# Patient Record
Sex: Female | Born: 1960 | Race: Black or African American | Hispanic: No | Marital: Married | Smoking: Never smoker
Health system: Southern US, Community
[De-identification: ages and names within clinical notes are randomized; demographics above are authoritative.]

## PROBLEM LIST (undated history)

## (undated) DIAGNOSIS — E785 Hyperlipidemia, unspecified: Secondary | ICD-10-CM

## (undated) DIAGNOSIS — J45909 Unspecified asthma, uncomplicated: Secondary | ICD-10-CM

## (undated) DIAGNOSIS — M1990 Unspecified osteoarthritis, unspecified site: Secondary | ICD-10-CM

## (undated) DIAGNOSIS — E079 Disorder of thyroid, unspecified: Secondary | ICD-10-CM

## (undated) DIAGNOSIS — G510 Bell's palsy: Secondary | ICD-10-CM

## (undated) DIAGNOSIS — E039 Hypothyroidism, unspecified: Secondary | ICD-10-CM

## (undated) DIAGNOSIS — I639 Cerebral infarction, unspecified: Secondary | ICD-10-CM

## (undated) DIAGNOSIS — I10 Essential (primary) hypertension: Secondary | ICD-10-CM

## (undated) DIAGNOSIS — C801 Malignant (primary) neoplasm, unspecified: Secondary | ICD-10-CM

## (undated) HISTORY — PX: COLONOSCOPY: SHX174

## (undated) HISTORY — PX: EYE SURGERY: SHX253

## (undated) HISTORY — PX: ABDOMINAL HYSTERECTOMY: SHX81

## (undated) HISTORY — DX: Disorder of thyroid, unspecified: E07.9

## (undated) HISTORY — PX: THYROIDECTOMY: SHX17

## (undated) HISTORY — DX: Essential (primary) hypertension: I10

## (undated) HISTORY — PX: TUBAL LIGATION: SHX77

---

## 1985-01-29 HISTORY — PX: TUBAL LIGATION: SHX77

## 1999-01-30 HISTORY — PX: ABDOMINAL HYSTERECTOMY: SHX81

## 2009-01-29 HISTORY — PX: COLONOSCOPY: SHX174

## 2009-01-29 HISTORY — PX: TOTAL THYROIDECTOMY: SHX2547

## 2012-03-10 ENCOUNTER — Ambulatory Visit: Payer: Self-pay | Admitting: Obstetrics and Gynecology

## 2012-10-15 ENCOUNTER — Ambulatory Visit: Payer: Self-pay | Admitting: Orthopedic Surgery

## 2013-12-02 ENCOUNTER — Ambulatory Visit: Payer: Self-pay | Admitting: Internal Medicine

## 2014-03-09 DIAGNOSIS — E785 Hyperlipidemia, unspecified: Secondary | ICD-10-CM | POA: Insufficient documentation

## 2014-03-09 DIAGNOSIS — E039 Hypothyroidism, unspecified: Secondary | ICD-10-CM | POA: Insufficient documentation

## 2014-03-09 DIAGNOSIS — J45909 Unspecified asthma, uncomplicated: Secondary | ICD-10-CM | POA: Insufficient documentation

## 2014-03-09 DIAGNOSIS — I10 Essential (primary) hypertension: Secondary | ICD-10-CM | POA: Insufficient documentation

## 2014-11-29 ENCOUNTER — Other Ambulatory Visit: Payer: Self-pay | Admitting: Internal Medicine

## 2014-11-29 DIAGNOSIS — Z1231 Encounter for screening mammogram for malignant neoplasm of breast: Secondary | ICD-10-CM

## 2014-12-13 ENCOUNTER — Ambulatory Visit
Admission: RE | Admit: 2014-12-13 | Discharge: 2014-12-13 | Disposition: A | Discharge status: Home / Self Care | Payer: BLUE CROSS/BLUE SHIELD | Source: Ambulatory Visit | Attending: Internal Medicine | Admitting: Internal Medicine

## 2014-12-13 DIAGNOSIS — Z1231 Encounter for screening mammogram for malignant neoplasm of breast: Secondary | ICD-10-CM

## 2014-12-13 HISTORY — DX: Malignant (primary) neoplasm, unspecified: C80.1

## 2015-01-30 DIAGNOSIS — Q859 Phakomatosis, unspecified: Secondary | ICD-10-CM

## 2015-01-30 HISTORY — DX: Phakomatosis, unspecified: Q85.9

## 2015-02-10 ENCOUNTER — Other Ambulatory Visit: Payer: Self-pay | Admitting: Internal Medicine

## 2015-02-10 DIAGNOSIS — R911 Solitary pulmonary nodule: Secondary | ICD-10-CM

## 2015-02-23 ENCOUNTER — Ambulatory Visit
Admission: RE | Admit: 2015-02-23 | Discharge: 2015-02-23 | Disposition: A | Discharge status: Home / Self Care | Payer: BLUE CROSS/BLUE SHIELD | Source: Ambulatory Visit | Attending: Internal Medicine | Admitting: Internal Medicine

## 2015-02-23 DIAGNOSIS — R911 Solitary pulmonary nodule: Secondary | ICD-10-CM | POA: Insufficient documentation

## 2015-02-24 ENCOUNTER — Other Ambulatory Visit: Payer: Self-pay | Admitting: Internal Medicine

## 2015-02-24 DIAGNOSIS — R911 Solitary pulmonary nodule: Secondary | ICD-10-CM

## 2015-03-02 ENCOUNTER — Ambulatory Visit: Admission: RE | Admit: 2015-03-02 | Payer: BLUE CROSS/BLUE SHIELD | Source: Ambulatory Visit

## 2015-03-23 ENCOUNTER — Ambulatory Visit
Admission: RE | Admit: 2015-03-23 | Discharge: 2015-03-23 | Disposition: A | Discharge status: Home / Self Care | Payer: BLUE CROSS/BLUE SHIELD | Source: Ambulatory Visit | Attending: Internal Medicine | Admitting: Internal Medicine

## 2015-03-23 DIAGNOSIS — R918 Other nonspecific abnormal finding of lung field: Secondary | ICD-10-CM | POA: Diagnosis not present

## 2015-03-23 DIAGNOSIS — R911 Solitary pulmonary nodule: Secondary | ICD-10-CM

## 2015-03-23 LAB — GLUCOSE, CAPILLARY: GLUCOSE-CAPILLARY: 106 mg/dL — AB (ref 65–99)

## 2015-03-23 MED ORDER — FLUDEOXYGLUCOSE F - 18 (FDG) INJECTION
12.6100 | Freq: Once | INTRAVENOUS | Status: AC | PRN
  Administered 2015-03-23: 12.61 via INTRAVENOUS

## 2015-04-07 ENCOUNTER — Encounter: Payer: Self-pay | Admitting: Cardiothoracic Surgery

## 2015-04-07 ENCOUNTER — Inpatient Hospital Stay: Payer: BLUE CROSS/BLUE SHIELD | Attending: Cardiothoracic Surgery | Admitting: Cardiothoracic Surgery

## 2015-04-07 VITALS — BP 167/81 | HR 54 | Temp 98.6°F | Ht 65.0 in | Wt 206.0 lb

## 2015-04-07 DIAGNOSIS — Z90710 Acquired absence of both cervix and uterus: Secondary | ICD-10-CM | POA: Diagnosis not present

## 2015-04-07 DIAGNOSIS — R918 Other nonspecific abnormal finding of lung field: Secondary | ICD-10-CM | POA: Diagnosis not present

## 2015-04-07 DIAGNOSIS — Z85850 Personal history of malignant neoplasm of thyroid: Secondary | ICD-10-CM | POA: Insufficient documentation

## 2015-04-07 DIAGNOSIS — R0602 Shortness of breath: Secondary | ICD-10-CM | POA: Insufficient documentation

## 2015-04-07 DIAGNOSIS — M2550 Pain in unspecified joint: Secondary | ICD-10-CM | POA: Diagnosis not present

## 2015-04-07 DIAGNOSIS — R6883 Chills (without fever): Secondary | ICD-10-CM | POA: Insufficient documentation

## 2015-04-07 DIAGNOSIS — Z79899 Other long term (current) drug therapy: Secondary | ICD-10-CM | POA: Diagnosis not present

## 2015-04-07 DIAGNOSIS — I10 Essential (primary) hypertension: Secondary | ICD-10-CM | POA: Diagnosis not present

## 2015-04-07 DIAGNOSIS — R911 Solitary pulmonary nodule: Secondary | ICD-10-CM | POA: Diagnosis not present

## 2015-04-07 DIAGNOSIS — E890 Postprocedural hypothyroidism: Secondary | ICD-10-CM | POA: Diagnosis not present

## 2015-04-07 DIAGNOSIS — R05 Cough: Secondary | ICD-10-CM | POA: Insufficient documentation

## 2015-04-07 NOTE — Progress Notes (Signed)
Patient ID: Brenda Mckinney, female   DOB: 24-Jan-1961, 55 y.o.   MRN: [id number]  No chief complaint on file.   Referred By Dr. Lisette Grinder Reason for Referral right lower lobe mass  HPI Location, Quality, Duration, Severity, Timing, Context, Modifying Factors, Associated Signs and Symptoms.  Brenda Mckinney is a 55 y.o. female.  Her initial problems began several months ago towards October November when she experienced which she described as a cough. At that time she was on lisinopril and the lisinopril was held without any significant changes in her cough. Therefore the lisinopril was restarted and she was treated appropriately. In addition she was placed on an inhaler and ultimately a chest x-ray was done which was abnormal. This demonstrated a small right lower lobe nodule and a subsequent CT scan and PET scan were performed. I have independently reviewed both of these. 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 In addition the PET scan showed some mild uptake which was indeterminate for malignancy. Again this would be consistent with carcinoid or a low-grade adenocarcinoma. The patient states that otherwise she's been well and since February of this year her symptoms have completely resolved. She denied any fevers or chills. She denied any hemoptysis. She denied any weight loss. She does have occasional shortness of breath and wheezing whenever she is active. She is a lifelong nonsmoker.  The patient did undergo a thyroidectomy in 2011 followed by radioactive iodine for a thyroid carcinoma at [facility] in [place]. In addition about 15 years ago she underwent a partial hysterectomy for uterine fibroids. She has not established care with any oncologist at this time.   Past Medical History  Diagnosis Date  . Cancer ([place])     thyroid  . Hypertension   . Thyroid disease     No past surgical history  on file.  Family History  Problem Relation Age of Onset  . Breast cancer Paternal Aunt   . Lymphoma Sister     Social History Social History  Substance Use Topics  . Smoking status: Not on file  . Smokeless tobacco: Not on file  . Alcohol Use: Not on file    No Known Allergies  Current Outpatient Prescriptions  Medication Sig Dispense Refill  . Albuterol Sulfate 108 (90 Base) MCG/ACT AEPB Inhale into the lungs.    Marland Kitchen amLODipine (NORVASC) 5 MG tablet Take by mouth.    . fluticasone (FLONASE) 50 MCG/ACT nasal spray Place into the nose.    Marland Kitchen Fluticasone-Salmeterol (ADVAIR) 250-50 MCG/DOSE AEPB Inhale 1 puff into the lungs 2 (two) times daily.    Marland Kitchen levothyroxine (SYNTHROID, LEVOTHROID) 112 MCG tablet     . lisinopril-hydrochlorothiazide (PRINZIDE,ZESTORETIC) 20-25 MG tablet     . metoprolol tartrate (LOPRESSOR) 25 MG tablet     . simvastatin (ZOCOR) 40 MG tablet      No current facility-administered medications for this visit.      Review of Systems A complete review of systems was asked and was negative except for the following positive findingsOccasional chills, difficulty with vision, cough, shortness of breath, wheeze, joint pain.  Blood pressure 167/81, pulse 54, temperature 98.6 F (37 C), temperature source Oral, height 5\' 5"  (1.651 m), weight 206 lb (93.441 kg), SpO2 97 %.  Physical Exam CONSTITUTIONAL:  Pleasant, well-developed, well-nourished, and in no acute distress. EYES: Pupils equal and reactive to light, Sclera non-icteric EARS, NOSE, MOUTH AND THROAT:  The oropharynx was clear.  Dentition is good repair.  Oral mucosa pink and moist. LYMPH NODES:  Lymph nodes in the neck and axillae were normal RESPIRATORY:  Lungs were clear.  Normal respiratory effort without pathologic use of accessory muscles of respiration CARDIOVASCULAR: Heart was regular without murmurs.  There were no carotid bruits. GI: The abdomen was soft, nontender, and nondistended. There were no  palpable masses. There was no hepatosplenomegaly. There were normal bowel sounds in all quadrants. GU:  Rectal deferred.   MUSCULOSKELETAL:  Normal muscle strength and tone.  No clubbing or cyanosis.   SKIN:  There were no pathologic skin lesions.  There were no nodules on palpation. NEUROLOGIC:  Sensation is normal.  Cranial nerves are grossly intact. PSYCH:  Oriented to person, place and time.  Mood and affect are normal.  Data Reviewed CT scan and PET scan  I have personally reviewed the patient's imaging, laboratory findings and medical records.    Assessment    I have independently reviewed the patient's CT scan and PET scan. 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 I had a long discussion with her and her husband regarding the options. I'm surprised that a CT scan of the chest has not been performed since she had thyroid cancer with radioactive iodine. I asked the patient to obtain all of her prior records from that this hospital in [place] and make another appointment so I can review them. In addition I would like her to see one of our oncologist for continued surveillance.    Plan    I gave the patient my business card and told her to contact Mr. Melinda Crutch next week when he returns. He can assist her in obtaining her records from [place]. When these records are available I will see her back. I do think that these be helpful making further decisions regarding the management of her right lower lobe lung nodule.       Nestor Lewandowsky, MD 04/07/2015, 5:20 PM

## 2015-05-18 ENCOUNTER — Other Ambulatory Visit: Payer: Self-pay | Admitting: Cardiothoracic Surgery

## 2015-05-18 ENCOUNTER — Inpatient Hospital Stay
Admission: RE | Admit: 2015-05-18 | Discharge: 2015-05-18 | Disposition: A | Discharge status: Home / Self Care | Payer: Self-pay | Source: Ambulatory Visit | Attending: Cardiothoracic Surgery | Admitting: Cardiothoracic Surgery

## 2015-05-18 DIAGNOSIS — R911 Solitary pulmonary nodule: Secondary | ICD-10-CM

## 2015-05-19 ENCOUNTER — Encounter: Payer: Self-pay | Admitting: Cardiothoracic Surgery

## 2015-05-19 ENCOUNTER — Inpatient Hospital Stay: Payer: BLUE CROSS/BLUE SHIELD

## 2015-05-19 ENCOUNTER — Inpatient Hospital Stay: Payer: BLUE CROSS/BLUE SHIELD | Attending: Cardiothoracic Surgery | Admitting: Cardiothoracic Surgery

## 2015-05-19 VITALS — BP 147/102 | HR 53 | Temp 98.1°F | Ht 65.0 in | Wt 201.8 lb

## 2015-05-19 DIAGNOSIS — R918 Other nonspecific abnormal finding of lung field: Secondary | ICD-10-CM | POA: Insufficient documentation

## 2015-05-19 LAB — CBC WITH DIFFERENTIAL/PLATELET
BASOS PCT: 1 %
Basophils Absolute: 0.1 10*3/uL (ref 0–0.1)
EOS ABS: 0.7 10*3/uL (ref 0–0.7)
EOS PCT: 12 %
HCT: 40.6 % (ref 35.0–47.0)
HEMOGLOBIN: 13.6 g/dL (ref 12.0–16.0)
Lymphocytes Relative: 32 %
Lymphs Abs: 1.9 10*3/uL (ref 1.0–3.6)
MCH: 28 pg (ref 26.0–34.0)
MCHC: 33.6 g/dL (ref 32.0–36.0)
MCV: 83.5 fL (ref 80.0–100.0)
MONO ABS: 0.5 10*3/uL (ref 0.2–0.9)
MONOS PCT: 8 %
NEUTROS PCT: 47 %
Neutro Abs: 2.9 10*3/uL (ref 1.4–6.5)
PLATELETS: 284 10*3/uL (ref 150–440)
RBC: 4.86 MIL/uL (ref 3.80–5.20)
RDW: 14.2 % (ref 11.5–14.5)
WBC: 6.1 10*3/uL (ref 3.6–11.0)

## 2015-05-19 LAB — COMPREHENSIVE METABOLIC PANEL
ALBUMIN: 4.7 g/dL (ref 3.5–5.0)
ALT: 24 U/L (ref 14–54)
ANION GAP: 6 (ref 5–15)
AST: 23 U/L (ref 15–41)
Alkaline Phosphatase: 99 U/L (ref 38–126)
BUN: 13 mg/dL (ref 6–20)
CO2: 30 mmol/L (ref 22–32)
Calcium: 9.3 mg/dL (ref 8.9–10.3)
Chloride: 103 mmol/L (ref 101–111)
Creatinine, Ser: 0.81 mg/dL (ref 0.44–1.00)
GFR calc non Af Amer: >60 mL/min (ref >60)
GLUCOSE: 106 mg/dL — AB (ref 65–99)
POTASSIUM: 3.5 mmol/L (ref 3.5–5.1)
SODIUM: 139 mmol/L (ref 135–145)
Total Bilirubin: 0.5 mg/dL (ref 0.3–1.2)
Total Protein: 8 g/dL (ref 6.5–8.1)

## 2015-05-19 LAB — APTT: APTT: 32 s (ref 24–36)

## 2015-05-19 LAB — PROTIME-INR
INR: 1.17
Prothrombin Time: 15.1 seconds — ABNORMAL HIGH (ref 11.4–15.0)

## 2015-05-19 NOTE — Progress Notes (Signed)
[facility] Post-Op Note  Patient ID: Brenda Mckinney, female   DOB: [birth year], 55 y.o.   MRN: [id number]  HISTORY: She returns today in follow-up. She's had no new problems. She did obtain all the records from [place] regarding her thyroid surgery. Unfortunately none of these contained a CT scan of the chest or a PET scan.   Filed Vitals:   05/19/15 1423  BP: 147/102  Pulse: 53  Temp: 98.1 F (36.7 C)     EXAM: Resp: Lungs are clear bilaterally.  No respiratory distress, normal effort. Heart:  Regular without murmurs Abd:  Abdomen is soft, non distended and non tender. No masses are palpable.  There is no rebound and no guarding.  Neurological: Alert and oriented to person, place, and time. Coordination normal.  Skin: Skin is warm and dry. No rash noted. No diaphoretic. No erythema. No pallor.  Psychiatric: Normal mood and affect. Normal behavior. Judgment and thought content normal.    ASSESSMENT: I have reviewed the results of the CT scan and PET scan have been made here. I discussed this with her today and her husband who participated by phone. The right lower lobe mass is rounded and only mildly hypermetabolic suggesting this might be a benign lesion. However during her presentation her multidisciplinary thoracic oncology conference they recommended a percutaneous biopsy. 4 we will go ahead and set that up.   PLAN:   We will obtain some routine laboratory studies today. We will obtain a CT-guided needle biopsy. I will see the patient back next week along with one of our oncologist.    Nestor Lewandowsky, MD

## 2015-05-20 ENCOUNTER — Encounter: Payer: Self-pay | Admitting: Cardiothoracic Surgery

## 2015-05-23 ENCOUNTER — Telehealth: Payer: Self-pay | Admitting: Cardiothoracic Surgery

## 2015-05-23 NOTE — Telephone Encounter (Signed)
Patient returned phone call. I explained information below. I explained that if she has not been scheduled by Wednesday, 05/25/15; I would like her to contact me so that I can call the radiologist once again. She verbalizes understanding.

## 2015-05-23 NOTE — Telephone Encounter (Signed)
Patient has called and states that she has not been advised of when her lung biopsy appointment. She stated that it was initially scheduled for 05/23/15 however it was canceled and that someone was to contact her to set up a new time. She states that she was advised that she would have an appointment with Dr Genevive Bi on 05/26/15 to discuss the results. Please call patient with appointment time and date.

## 2015-05-23 NOTE — Telephone Encounter (Signed)
Spoke with [employer] at this time. They explained that the case in review by the radiologist at this time and as soon as the review has been completed, Central Scheduling will call the patient to set this up.  Called patient to explain to her what the status is. No answer. Left voicemail requesting return phone call.

## 2015-05-24 ENCOUNTER — Telehealth: Payer: Self-pay

## 2015-05-24 NOTE — Telephone Encounter (Signed)
Special Procedures form filled out and faxed to [facility] at this time.

## 2015-05-25 NOTE — Telephone Encounter (Signed)
Spoke with Judy from Scheduling whom states that CT scanner is down and they do not know when this will be available for scheduling until repair person gets here at 7pm tonight. Will need to call Barbara back in am for update on scheduling this. 

## 2015-05-26 ENCOUNTER — Inpatient Hospital Stay: Payer: BLUE CROSS/BLUE SHIELD | Admitting: Cardiothoracic Surgery

## 2015-05-27 NOTE — Telephone Encounter (Signed)
Spoke with Dr. Genevive Bi and he explained that patient does not wish to have this done at this time. We will see the patient back in follow-up and then decide on tests needed.

## 2015-06-02 ENCOUNTER — Inpatient Hospital Stay: Payer: BLUE CROSS/BLUE SHIELD | Attending: Cardiothoracic Surgery | Admitting: Cardiothoracic Surgery

## 2015-06-02 NOTE — Progress Notes (Deleted)
Subjective:     Patient ID: Brenda Mckinney, female   DOB: 09/22/1960, 55 y.o.   MRN: [id number]  HPI   Review of Systems     Objective:   Physical Exam     Assessment:     ***    Plan:     ***

## 2015-06-10 ENCOUNTER — Encounter: Payer: Self-pay | Admitting: Cardiothoracic Surgery

## 2015-06-10 ENCOUNTER — Ambulatory Visit (INDEPENDENT_AMBULATORY_CARE_PROVIDER_SITE_OTHER): Payer: BLUE CROSS/BLUE SHIELD | Admitting: Cardiothoracic Surgery

## 2015-06-10 VITALS — BP 168/95 | HR 55 | Temp 98.1°F | Ht 65.0 in | Wt 202.0 lb

## 2015-06-10 DIAGNOSIS — R918 Other nonspecific abnormal finding of lung field: Secondary | ICD-10-CM

## 2015-06-10 NOTE — Progress Notes (Signed)
[facility]in[facility] Inpatient Post-Op Note  Patient ID: Brenda Mckinney, female   DOB: [birth year], 55 y.o.   MRN: [id number]  HISTORY: She returns today in follow-up. She had a chest x-ray that was made back in 2011 that was found on her information obtained from [place]. This was compared to some more recent x-rays that she had made. The nodule in the lung is certainly slightly larger. She comes in today to discuss the options for management. She does not have any shortness of breath. She's had no fevers or chills.   Filed Vitals:   06/10/15 1011  BP: 168/95  Pulse: 55  Temp: 98.1 F (36.7 C)     EXAM: Resp: Lungs are clear bilaterally.  No respiratory distress, normal effort. Heart:  Regular without murmurs Abd:  Abdomen is soft, non distended and non tender. No masses are palpable.  There is no rebound and no guarding.  Neurological: Alert and oriented to person, place, and time. Coordination normal.  Skin: Skin is warm and dry. No rash noted. No diaphoretic. No erythema. No pallor.  Psychiatric: Normal mood and affect. Normal behavior. Judgment and thought content normal.    ASSESSMENT: I have compared the chest x-rays from 2011 and 2017. The nodule in the right lower lobe is certainly somewhat larger. I would estimated to be anywhere between 2 and 4 mm larger based upon chest x-ray findings. I had a long discussion with her regarding the options. She would like to have surgery to have this removed. I explained her that we could do this through a thoracotomy or thoracoscopy. I told her the dangers and disadvantages of various options.   PLAN:   She is scheduled to attend a wedding recently likely and is also scheduled to have a new addition to her family. She would therefore like to wait until the end of June to schedule her surgery. She'll come back at that time and will make further recommendations.    Nestor Lewandowsky, MD

## 2015-07-05 ENCOUNTER — Other Ambulatory Visit: Payer: Self-pay | Admitting: Cardiothoracic Surgery

## 2015-07-05 ENCOUNTER — Inpatient Hospital Stay
Admission: RE | Admit: 2015-07-05 | Discharge: 2015-07-05 | Disposition: A | Discharge status: Home / Self Care | Payer: Self-pay | Source: Ambulatory Visit | Attending: Cardiothoracic Surgery | Admitting: Cardiothoracic Surgery

## 2015-07-05 DIAGNOSIS — R918 Other nonspecific abnormal finding of lung field: Secondary | ICD-10-CM

## 2015-07-12 ENCOUNTER — Telehealth: Payer: Self-pay

## 2015-07-12 ENCOUNTER — Ambulatory Visit (INDEPENDENT_AMBULATORY_CARE_PROVIDER_SITE_OTHER): Payer: BLUE CROSS/BLUE SHIELD | Admitting: Cardiothoracic Surgery

## 2015-07-12 ENCOUNTER — Encounter: Payer: Self-pay | Admitting: Cardiothoracic Surgery

## 2015-07-12 ENCOUNTER — Other Ambulatory Visit: Payer: Self-pay

## 2015-07-12 VITALS — BP 167/82 | HR 51 | Temp 98.3°F | Ht 65.0 in | Wt 209.6 lb

## 2015-07-12 DIAGNOSIS — R918 Other nonspecific abnormal finding of lung field: Secondary | ICD-10-CM

## 2015-07-12 NOTE — Progress Notes (Signed)
Patient ID: Brenda Mckinney, female   DOB: 1960/05/06, 55 y.o.   MRN: [id number]  Chief Complaint  Patient presents with  . Follow-up    left Lung Nodule    HPI Brenda Mckinney is a 55 y.o. female.  She carries a diagnosis of thyroid cancer and is status post thyroidectomy for that. She was recently found to have a right lower lobe nodule which upon further review is been present for several years but slightly increased in size. However she would like to have this resected. She does not complain of any shortness of breath. She states she has had some weight gain. She has some occasional right-sided abdominal pain but no cough fevers or chills.   Past Medical History  Diagnosis Date  . Cancer ([place])     thyroid  . Hypertension   . Thyroid disease     History reviewed. No pertinent past surgical history.  Family History  Problem Relation Age of Onset  . Breast cancer Paternal Aunt   . Lymphoma Sister     Social History Social History  Substance Use Topics  . Smoking status: Never Smoker   . Smokeless tobacco: Never Used  . Alcohol Use: No    No Known Allergies  Current Outpatient Prescriptions  Medication Sig Dispense Refill  . Albuterol Sulfate 108 (90 Base) MCG/ACT AEPB Inhale into the lungs.    Marland Kitchen amLODipine (NORVASC) 5 MG tablet Take by mouth.    . fluticasone (FLONASE) 50 MCG/ACT nasal spray Place into the nose.    Marland Kitchen Fluticasone-Salmeterol (ADVAIR) 250-50 MCG/DOSE AEPB Inhale 1 puff into the lungs 2 (two) times daily.    Marland Kitchen levothyroxine (SYNTHROID, LEVOTHROID) 112 MCG tablet     . lisinopril-hydrochlorothiazide (PRINZIDE,ZESTORETIC) 20-25 MG tablet     . metoprolol tartrate (LOPRESSOR) 25 MG tablet     . simvastatin (ZOCOR) 40 MG tablet      No current facility-administered medications for this visit.    Location, Quality, Duration, Severity, Timing, Context, Modifying Factors, Associated Signs and Symptoms.  Review of Systems A complete review of systems was  asked and was negative except for the following positive findingsOccasional abdominal pain.  Blood pressure 167/82, pulse 51, temperature 98.3 F (36.8 C), temperature source Oral, height 5\' 5"  (1.651 m), weight 209 lb 9.6 oz (95.074 kg), SpO2 96 %.  Physical Exam CONSTITUTIONAL:  Pleasant, well-developed, well-nourished, and in no acute distress. EYES: Pupils equal and reactive to light, Sclera non-icteric EARS, NOSE, MOUTH AND THROAT:  The oropharynx was clear.  Dentition is good repair.  Oral mucosa pink and moist. LYMPH NODES:  Lymph nodes in the neck and axillae were normal RESPIRATORY:  Lungs showed some and inspiratory wheezes on the left..  Normal respiratory effort without pathologic use of accessory muscles of respiration CARDIOVASCULAR: Heart was regular without murmurs.  There were no carotid bruits. GI: The abdomen was soft, nontender, and nondistended. There were no palpable masses. There was no hepatosplenomegaly. There were normal bowel sounds in all quadrants. GU:   MUSCULOSKELETAL:  Normal muscle strength and tone.  No clubbing or cyanosis.   SKIN:  There were no pathologic skin lesions.  There were no nodules on palpation. NEUROLOGIC:  Sensation is normal.  Cranial nerves are grossly intact. PSYCH:  Oriented to person, place and time.  Mood and affect are normal.  Data Reviewed  I have personally reviewed the patient's imaging and medical records.    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 We plan a preoperative bronchoscopy with right thoracoscopy possible thoracotomy and lung resection. She understands that we will attempt to minimize the extent of her lung resection but a wedge or lobectomy may be required. We'll go ahead and check some pulmonary function studies as well as some routine laboratory examination. Her surgery done towards the end of this month. We will accommodate her  request.       Nestor Lewandowsky, MD 07/12/2015, 10:56 AM

## 2015-07-12 NOTE — Telephone Encounter (Signed)
Called patient, Left message for her to call me back. Also left phone number so she can contact (Cardiopulmonay- [phone]) to reschedule her appointment due to her not being able to go on 07/14/15 2 9:30.

## 2015-07-12 NOTE — Patient Instructions (Signed)
Please call our office if you have questions or concerns.   We will call you with your appointment to have the Pulmonary Function Test.   We will call you with the details of your surgery plans as soon as they are available.   Please see your [facility]) Pre-care sheet for further information.

## 2015-07-12 NOTE — Telephone Encounter (Signed)
Patient called to let me know she had received my message in regards to calling Cardiopulmonary to reschedule her PFT testing. She stated she left a message and is waiting on a call back from scheduling. She was encouraged to try reaching them tomorrow morning as to getting a definite appointment made. She stated she would do so.

## 2015-07-12 NOTE — Telephone Encounter (Signed)
Brenda Mckinney, please call patient. You scheduled her a pulmonary function test on 07/14/15 at 9:15 and she has another appointment at 9:30 that morning. She states that day is okay, just needs a different time on that day.

## 2015-07-12 NOTE — Telephone Encounter (Signed)
LVM to return call -I have her  pulmonary function test  scheduled on 07/14/15 @ 9:15.

## 2015-07-13 ENCOUNTER — Telehealth: Payer: Self-pay | Admitting: Cardiothoracic Surgery

## 2015-07-13 NOTE — Telephone Encounter (Signed)
Pt advised of pre op date/time and sx date. Sx: 07/25/15 with Dr Oaks-Dr Adonis Huguenin assisting--Pre op bronchoscopy, right thoracoscopy with possible right thoracotomy with lung resection.  Pre op: 07/18/15 @ 8:00 am--office.   Patient made aware to call [phone], between 1-3:00 pm the day before surgery, to find out what time to arrive.

## 2015-07-14 ENCOUNTER — Ambulatory Visit: Payer: BLUE CROSS/BLUE SHIELD

## 2015-07-14 ENCOUNTER — Ambulatory Visit: Payer: BLUE CROSS/BLUE SHIELD | Attending: Cardiothoracic Surgery

## 2015-07-14 DIAGNOSIS — R918 Other nonspecific abnormal finding of lung field: Secondary | ICD-10-CM | POA: Diagnosis present

## 2015-07-14 LAB — BLOOD GAS, ARTERIAL
ACID-BASE EXCESS: 3 mmol/L (ref 0.0–3.0)
Allens test (pass/fail): POSITIVE — AB
BICARBONATE: 27.1 meq/L (ref 21.0–28.0)
FIO2: 21
O2 Saturation: 94.6 %
PH ART: 7.45 (ref 7.350–7.450)
PO2 ART: 70 mmHg — AB (ref 83.0–108.0)
Patient temperature: 37
pCO2 arterial: 39 mmHg (ref 32.0–48.0)

## 2015-07-14 MED ORDER — ALBUTEROL SULFATE (2.5 MG/3ML) 0.083% IN NEBU
2.5000 mg | INHALATION_SOLUTION | Freq: Once | RESPIRATORY_TRACT | Status: AC
  Administered 2015-07-14: 2.5 mg via RESPIRATORY_TRACT
  Filled 2015-07-14: qty 3

## 2015-07-18 ENCOUNTER — Telehealth: Payer: Self-pay

## 2015-07-18 ENCOUNTER — Encounter
Admission: RE | Admit: 2015-07-18 | Discharge: 2015-07-18 | Disposition: A | Discharge status: Home / Self Care | Payer: BLUE CROSS/BLUE SHIELD | Source: Ambulatory Visit | Attending: Cardiothoracic Surgery | Admitting: Cardiothoracic Surgery

## 2015-07-18 ENCOUNTER — Ambulatory Visit
Admission: RE | Admit: 2015-07-18 | Discharge: 2015-07-18 | Disposition: A | Discharge status: Home / Self Care | Payer: BLUE CROSS/BLUE SHIELD | Source: Ambulatory Visit | Attending: Cardiothoracic Surgery | Admitting: Cardiothoracic Surgery

## 2015-07-18 DIAGNOSIS — Z01811 Encounter for preprocedural respiratory examination: Secondary | ICD-10-CM

## 2015-07-18 DIAGNOSIS — Z01818 Encounter for other preprocedural examination: Secondary | ICD-10-CM | POA: Diagnosis present

## 2015-07-18 DIAGNOSIS — Z01810 Encounter for preprocedural cardiovascular examination: Secondary | ICD-10-CM | POA: Insufficient documentation

## 2015-07-18 DIAGNOSIS — I10 Essential (primary) hypertension: Secondary | ICD-10-CM | POA: Insufficient documentation

## 2015-07-18 DIAGNOSIS — Z01812 Encounter for preprocedural laboratory examination: Secondary | ICD-10-CM | POA: Diagnosis not present

## 2015-07-18 DIAGNOSIS — I517 Cardiomegaly: Secondary | ICD-10-CM | POA: Diagnosis not present

## 2015-07-18 DIAGNOSIS — R911 Solitary pulmonary nodule: Secondary | ICD-10-CM | POA: Insufficient documentation

## 2015-07-18 DIAGNOSIS — R001 Bradycardia, unspecified: Secondary | ICD-10-CM | POA: Insufficient documentation

## 2015-07-18 HISTORY — DX: Hypothyroidism, unspecified: E03.9

## 2015-07-18 LAB — COMPREHENSIVE METABOLIC PANEL
ALBUMIN: 4.3 g/dL (ref 3.5–5.0)
ALT: 15 U/L (ref 14–54)
AST: 21 U/L (ref 15–41)
Alkaline Phosphatase: 95 U/L (ref 38–126)
Anion gap: 8 (ref 5–15)
BILIRUBIN TOTAL: 0.9 mg/dL (ref 0.3–1.2)
BUN: 12 mg/dL (ref 6–20)
CO2: 28 mmol/L (ref 22–32)
Calcium: 9.1 mg/dL (ref 8.9–10.3)
Chloride: 104 mmol/L (ref 101–111)
Creatinine, Ser: 0.71 mg/dL (ref 0.44–1.00)
GFR calc Af Amer: >60 mL/min (ref >60)
GFR calc non Af Amer: >60 mL/min (ref >60)
GLUCOSE: 68 mg/dL (ref 65–99)
POTASSIUM: 3.3 mmol/L — AB (ref 3.5–5.1)
Sodium: 140 mmol/L (ref 135–145)
TOTAL PROTEIN: 7.8 g/dL (ref 6.5–8.1)

## 2015-07-18 LAB — CBC
HEMATOCRIT: 38.9 % (ref 35.0–47.0)
HEMOGLOBIN: 13 g/dL (ref 12.0–16.0)
MCH: 27.8 pg (ref 26.0–34.0)
MCHC: 33.3 g/dL (ref 32.0–36.0)
MCV: 83.3 fL (ref 80.0–100.0)
Platelets: 274 10*3/uL (ref 150–440)
RBC: 4.67 MIL/uL (ref 3.80–5.20)
RDW: 14.4 % (ref 11.5–14.5)
WBC: 5.8 10*3/uL (ref 3.6–11.0)

## 2015-07-18 LAB — URINALYSIS COMPLETE WITH MICROSCOPIC (ARMC ONLY)
Bilirubin Urine: NEGATIVE
Glucose, UA: NEGATIVE mg/dL
HGB URINE DIPSTICK: NEGATIVE
Ketones, ur: NEGATIVE mg/dL
LEUKOCYTES UA: NEGATIVE
NITRITE: NEGATIVE
PROTEIN: NEGATIVE mg/dL
SPECIFIC GRAVITY, URINE: 1.004 — AB (ref 1.005–1.030)
pH: 6 (ref 5.0–8.0)

## 2015-07-18 LAB — APTT: APTT: 30 s (ref 24–36)

## 2015-07-18 LAB — SURGICAL PCR SCREEN
MRSA, PCR: NEGATIVE
Staphylococcus aureus: NEGATIVE

## 2015-07-18 LAB — PROTIME-INR
INR: 1.14
Prothrombin Time: 14.8 seconds (ref 11.4–15.0)

## 2015-07-18 NOTE — Patient Instructions (Signed)
Your procedure is scheduled on: Monday 07/25/15 Report to Day Surgery. 2ND FLOOR MEDICAL MALL ENTRANCE To find out your arrival time please call [phone] between 1PM - 3PM on Friday 07/22/15.  Remember: Instructions that are not followed completely may result in serious medical risk, up to and including death, or upon the discretion of your surgeon and anesthesiologist your surgery may need to be rescheduled.    __X__ 1. Do not eat food or drink liquids after midnight. No gum chewing or hard candies.     __X__ 2. No Alcohol for 24 hours before or after surgery.   ____ 3. Bring all medications with you on the day of surgery if instructed.    __X__ 4. Notify your doctor if there is any change in your medical condition     (cold, fever, infections).     Do not wear jewelry, make-up, hairpins, clips or nail polish.  Do not wear lotions, powders, or perfumes.   Do not shave 48 hours prior to surgery. Men may shave face and neck.  Do not bring valuables to the hospital.    [facility] is not responsible for any belongings or valuables.               Contacts, dentures or bridgework may not be worn into surgery.  Leave your suitcase in the car. After surgery it may be brought to your room.  For patients admitted to the hospital, discharge time is determined by your                treatment team.   Patients discharged the day of surgery will not be allowed to drive home.   Please read over the following fact sheets that you were given:   MRSA Information and Surgical Site Infection Prevention   __X__ Take these medicines the morning of surgery with A SIP OF WATER:    1. AMLODIPINE  2. LEVOTHYROXINE  3. METOPROLOL  4. SIMVASTATIN  5.  6.  ____ Fleet Enema (as directed)   __X__ Use CHG Soap as directed  __X__ Use inhalers on the day of surgery  ____ Stop metformin 2 days prior to surgery    ____ Take 1/2 of usual insulin dose the night before surgery and none on the morning of  surgery.   ____ Stop Coumadin/Plavix/aspirin on   __X__ Stop Anti-inflammatories on TODAY (IBUPROFEN ADVIL ALEVE)   ____ Stop supplements until after surgery.    ____ Bring C-Pap to the hospital.

## 2015-07-18 NOTE — Pre-Procedure Instructions (Addendum)
Notified Freda Munro  At office patient K+ 3.3 and will need supplement. Recheck am of surgery

## 2015-07-18 NOTE — Telephone Encounter (Signed)
Received fax at this time stating that patient's pre-op potassium is 3.3. Per Potassium protocol, Patient will take K-Dur 39meq BID x 5 days prior to surgery and be rechecked the am of surgery.  Only long term pharmacy available on the chart. So medication cannot be called in at this time.  Patient should start medication 07/20/15 and take 1 tablet twice daily x 5 days.   Call made to patient at this time. No answer. Left voicemail for return phone call.

## 2015-07-19 NOTE — Telephone Encounter (Signed)
Noted  

## 2015-07-19 NOTE — Telephone Encounter (Signed)
Patient has called back and was advised of her Potassium level and of the medication that she needs to take. She has requested for this to be called in at [facility] on [address]. I have advised her that she will need to start the medication on 07/20/15 and to take 1 tablet twice daily for 5 days and that her Potassium will be rechecked the morning of surgery. Patient understands.

## 2015-07-20 ENCOUNTER — Telehealth: Payer: Self-pay | Admitting: Cardiothoracic Surgery

## 2015-07-20 NOTE — Telephone Encounter (Signed)
Please contact [facility] in [place] on [address]. Patients Potassium that was sent in yesterday did not go through and she is supposed to start taking Potassium before surgery. She does have another pharmacy listed as her preferred pharmacy, but would like this prescription called into [facility] on [address] in [place].

## 2015-07-20 NOTE — Telephone Encounter (Signed)
Patient now wants prescription called into her pharmacy at [facility] - it is listed in her preferred prescription list

## 2015-07-21 MED ORDER — POTASSIUM CHLORIDE CRYS ER 20 MEQ PO TBCR: 20.0000 meq | EXTENDED_RELEASE_TABLET | Freq: Two times a day (BID) | ORAL | Status: DC

## 2015-07-21 NOTE — Telephone Encounter (Signed)
Medication sent at this time to [facility].

## 2015-07-22 ENCOUNTER — Other Ambulatory Visit: Payer: BLUE CROSS/BLUE SHIELD

## 2015-07-22 LAB — ABO/RH: ABO/RH(D): O NEG

## 2015-07-25 ENCOUNTER — Inpatient Hospital Stay
Admission: RE | Admit: 2015-07-25 | Discharge: 2015-07-28 | DRG: 828 | Disposition: A | Discharge status: Home / Self Care | Payer: BLUE CROSS/BLUE SHIELD | Source: Ambulatory Visit | Attending: Cardiothoracic Surgery | Admitting: Cardiothoracic Surgery

## 2015-07-25 ENCOUNTER — Inpatient Hospital Stay: Payer: BLUE CROSS/BLUE SHIELD

## 2015-07-25 ENCOUNTER — Inpatient Hospital Stay: Payer: BLUE CROSS/BLUE SHIELD | Admitting: Anesthesiology

## 2015-07-25 ENCOUNTER — Encounter: Payer: Self-pay | Admitting: *Deleted

## 2015-07-25 ENCOUNTER — Encounter
Admission: RE | Disposition: A | Discharge status: Home / Self Care | Payer: Self-pay | Source: Ambulatory Visit | Attending: Cardiothoracic Surgery

## 2015-07-25 DIAGNOSIS — Q859 Phakomatosis, unspecified: Secondary | ICD-10-CM | POA: Diagnosis not present

## 2015-07-25 DIAGNOSIS — I10 Essential (primary) hypertension: Secondary | ICD-10-CM | POA: Diagnosis present

## 2015-07-25 DIAGNOSIS — D1431 Benign neoplasm of right bronchus and lung: Secondary | ICD-10-CM

## 2015-07-25 DIAGNOSIS — Z85850 Personal history of malignant neoplasm of thyroid: Secondary | ICD-10-CM

## 2015-07-25 DIAGNOSIS — K219 Gastro-esophageal reflux disease without esophagitis: Secondary | ICD-10-CM | POA: Diagnosis present

## 2015-07-25 DIAGNOSIS — E890 Postprocedural hypothyroidism: Secondary | ICD-10-CM | POA: Diagnosis present

## 2015-07-25 DIAGNOSIS — J45909 Unspecified asthma, uncomplicated: Secondary | ICD-10-CM | POA: Diagnosis present

## 2015-07-25 DIAGNOSIS — R918 Other nonspecific abnormal finding of lung field: Secondary | ICD-10-CM | POA: Diagnosis present

## 2015-07-25 DIAGNOSIS — Z09 Encounter for follow-up examination after completed treatment for conditions other than malignant neoplasm: Secondary | ICD-10-CM

## 2015-07-25 HISTORY — PX: VIDEO ASSISTED THORACOSCOPY (VATS)/THOROCOTOMY: SHX6173

## 2015-07-25 LAB — POCT I-STAT 4, (NA,K, GLUC, HGB,HCT)
Glucose, Bld: 111 mg/dL — ABNORMAL HIGH (ref 65–99)
HCT: 39 % (ref 36.0–46.0)
HEMOGLOBIN: 13.3 g/dL (ref 12.0–15.0)
POTASSIUM: 3.6 mmol/L (ref 3.5–5.1)
Sodium: 142 mmol/L (ref 135–145)

## 2015-07-25 LAB — PREPARE RBC (CROSSMATCH)

## 2015-07-25 SURGERY — VIDEO ASSISTED THORACOSCOPY (VATS)/THOROCOTOMY
Anesthesia: General | Laterality: Right | Wound class: Clean Contaminated

## 2015-07-25 MED ORDER — FAMOTIDINE 20 MG PO TABS
20.0000 mg | ORAL_TABLET | Freq: Once | ORAL | Status: AC
  Administered 2015-07-25: 20 mg via ORAL

## 2015-07-25 MED ORDER — METOPROLOL TARTRATE 25 MG PO TABS
25.0000 mg | ORAL_TABLET | Freq: Two times a day (BID) | ORAL | Status: DC
  Administered 2015-07-25 – 2015-07-28 (×6): 25 mg via ORAL
  Filled 2015-07-25 (×6): qty 1

## 2015-07-25 MED ORDER — PROPOFOL 10 MG/ML IV BOLUS
INTRAVENOUS | Status: DC | PRN
  Administered 2015-07-25: 170 mg via INTRAVENOUS

## 2015-07-25 MED ORDER — MORPHINE SULFATE (PF) 2 MG/ML IV SOLN
1.0000 mg | INTRAVENOUS | Status: DC | PRN
  Administered 2015-07-25: 2 mg via INTRAVENOUS
  Administered 2015-07-25: 1 mg via INTRAVENOUS
  Administered 2015-07-26: 2 mg via INTRAVENOUS
  Filled 2015-07-25 (×3): qty 1

## 2015-07-25 MED ORDER — DEXTROSE-NACL 5-0.45 % IV SOLN
INTRAVENOUS | Status: DC
  Administered 2015-07-25 – 2015-07-26 (×2): via INTRAVENOUS

## 2015-07-25 MED ORDER — BISACODYL 5 MG PO TBEC: 10.0000 mg | DELAYED_RELEASE_TABLET | Freq: Every day | ORAL | Status: DC

## 2015-07-25 MED ORDER — SODIUM CHLORIDE 0.9 % IJ SOLN
INTRAMUSCULAR | Status: AC
  Filled 2015-07-25: qty 50

## 2015-07-25 MED ORDER — ALBUTEROL SULFATE 108 (90 BASE) MCG/ACT IN AEPB: 2.0000 | INHALATION_SPRAY | RESPIRATORY_TRACT | Status: DC | PRN

## 2015-07-25 MED ORDER — DEXTROSE 5 % IV SOLN
1.5000 g | INTRAVENOUS | Status: AC
  Administered 2015-07-25: 1.5 g via INTRAVENOUS
  Filled 2015-07-25: qty 1.5

## 2015-07-25 MED ORDER — ONDANSETRON HCL 4 MG/2ML IJ SOLN
INTRAMUSCULAR | Status: DC | PRN
  Administered 2015-07-25: 4 mg via INTRAVENOUS

## 2015-07-25 MED ORDER — OXYCODONE HCL 5 MG PO TABS: 5.0000 mg | ORAL_TABLET | Freq: Once | ORAL | Status: DC | PRN

## 2015-07-25 MED ORDER — STERILE WATER FOR IRRIGATION IR SOLN
Status: DC | PRN
  Administered 2015-07-25: 50 mL

## 2015-07-25 MED ORDER — LISINOPRIL-HYDROCHLOROTHIAZIDE 20-25 MG PO TABS: 1.0000 | ORAL_TABLET | Freq: Every day | ORAL | Status: DC

## 2015-07-25 MED ORDER — ONDANSETRON HCL 4 MG/2ML IJ SOLN
4.0000 mg | Freq: Four times a day (QID) | INTRAMUSCULAR | Status: DC | PRN
  Administered 2015-07-26 – 2015-07-27 (×3): 4 mg via INTRAVENOUS
  Filled 2015-07-25 (×3): qty 2

## 2015-07-25 MED ORDER — SIMVASTATIN 40 MG PO TABS
40.0000 mg | ORAL_TABLET | Freq: Every day | ORAL | Status: DC
  Administered 2015-07-26 – 2015-07-28 (×3): 40 mg via ORAL
  Filled 2015-07-25 (×3): qty 1

## 2015-07-25 MED ORDER — OXYCODONE-ACETAMINOPHEN 5-325 MG PO TABS
1.0000 | ORAL_TABLET | ORAL | Status: DC | PRN
  Administered 2015-07-25 – 2015-07-28 (×4): 2 via ORAL
  Filled 2015-07-25 (×5): qty 2

## 2015-07-25 MED ORDER — AMLODIPINE BESYLATE 5 MG PO TABS
5.0000 mg | ORAL_TABLET | Freq: Every day | ORAL | Status: DC
  Administered 2015-07-26 – 2015-07-28 (×3): 5 mg via ORAL
  Filled 2015-07-25 (×3): qty 1

## 2015-07-25 MED ORDER — FENTANYL CITRATE (PF) 100 MCG/2ML IJ SOLN
INTRAMUSCULAR | Status: AC
  Administered 2015-07-25: 25 ug via INTRAVENOUS
  Filled 2015-07-25: qty 2

## 2015-07-25 MED ORDER — IPRATROPIUM-ALBUTEROL 0.5-2.5 (3) MG/3ML IN SOLN
RESPIRATORY_TRACT | Status: AC
  Administered 2015-07-25: 3 mL
  Filled 2015-07-25: qty 3

## 2015-07-25 MED ORDER — FLUTICASONE PROPIONATE 50 MCG/ACT NA SUSP
1.0000 | Freq: Every day | NASAL | Status: DC | PRN
  Filled 2015-07-25: qty 16

## 2015-07-25 MED ORDER — ALBUTEROL SULFATE (2.5 MG/3ML) 0.083% IN NEBU
2.5000 mg | INHALATION_SOLUTION | RESPIRATORY_TRACT | Status: DC
  Administered 2015-07-25 – 2015-07-26 (×4): 2.5 mg via RESPIRATORY_TRACT
  Filled 2015-07-25 (×3): qty 3

## 2015-07-25 MED ORDER — TRAMADOL HCL 50 MG PO TABS
50.0000 mg | ORAL_TABLET | Freq: Four times a day (QID) | ORAL | Status: DC
  Administered 2015-07-25: 50 mg via ORAL
  Administered 2015-07-25 – 2015-07-26 (×5): 100 mg via ORAL
  Administered 2015-07-27: 50 mg via ORAL
  Administered 2015-07-27: 100 mg via ORAL
  Administered 2015-07-27 – 2015-07-28 (×4): 50 mg via ORAL
  Filled 2015-07-25: qty 2
  Filled 2015-07-25: qty 1
  Filled 2015-07-25 (×2): qty 2
  Filled 2015-07-25: qty 1
  Filled 2015-07-25 (×5): qty 2
  Filled 2015-07-25 (×2): qty 1

## 2015-07-25 MED ORDER — NEOSTIGMINE METHYLSULFATE 10 MG/10ML IV SOLN
INTRAVENOUS | Status: DC | PRN
  Administered 2015-07-25: 4 mg via INTRAVENOUS

## 2015-07-25 MED ORDER — SODIUM CHLORIDE 0.9 % IV SOLN
INTRAVENOUS | Status: DC | PRN
  Administered 2015-07-25: 50 mL

## 2015-07-25 MED ORDER — ROCURONIUM BROMIDE 100 MG/10ML IV SOLN
INTRAVENOUS | Status: DC | PRN
  Administered 2015-07-25: 40 mg via INTRAVENOUS
  Administered 2015-07-25: 10 mg via INTRAVENOUS
  Administered 2015-07-25: 15 mg via INTRAVENOUS

## 2015-07-25 MED ORDER — SODIUM CHLORIDE 0.9 % IV SOLN: INTRAVENOUS | Status: DC | PRN

## 2015-07-25 MED ORDER — DEXAMETHASONE SODIUM PHOSPHATE 10 MG/ML IJ SOLN
INTRAMUSCULAR | Status: DC | PRN
  Administered 2015-07-25: 4 mg via INTRAVENOUS

## 2015-07-25 MED ORDER — FAMOTIDINE 20 MG PO TABS
ORAL_TABLET | ORAL | Status: AC
  Administered 2015-07-25: 20 mg via ORAL
  Filled 2015-07-25: qty 1

## 2015-07-25 MED ORDER — FENTANYL CITRATE (PF) 100 MCG/2ML IJ SOLN
INTRAMUSCULAR | Status: DC | PRN
  Administered 2015-07-25 (×3): 50 ug via INTRAVENOUS
  Administered 2015-07-25: 150 ug via INTRAVENOUS
  Administered 2015-07-25: 50 ug via INTRAVENOUS

## 2015-07-25 MED ORDER — GLYCOPYRROLATE 0.2 MG/ML IJ SOLN
INTRAMUSCULAR | Status: DC | PRN
Start: 2015-07-25 — End: 2015-07-25
  Administered 2015-07-25: .6 mg via INTRAVENOUS

## 2015-07-25 MED ORDER — HYDROCHLOROTHIAZIDE 25 MG PO TABS
25.0000 mg | ORAL_TABLET | Freq: Every day | ORAL | Status: DC
  Administered 2015-07-25 – 2015-07-28 (×4): 25 mg via ORAL
  Filled 2015-07-25 (×4): qty 1

## 2015-07-25 MED ORDER — LIDOCAINE HCL (CARDIAC) 20 MG/ML IV SOLN
INTRAVENOUS | Status: DC | PRN
  Administered 2015-07-25: 100 mg via INTRAVENOUS

## 2015-07-25 MED ORDER — BUPIVACAINE LIPOSOME 1.3 % IJ SUSP
INTRAMUSCULAR | Status: AC
  Filled 2015-07-25: qty 20

## 2015-07-25 MED ORDER — LISINOPRIL 20 MG PO TABS
20.0000 mg | ORAL_TABLET | Freq: Every day | ORAL | Status: DC
  Administered 2015-07-25 – 2015-07-28 (×4): 20 mg via ORAL
  Filled 2015-07-25 (×4): qty 1

## 2015-07-25 MED ORDER — FENTANYL CITRATE (PF) 100 MCG/2ML IJ SOLN
25.0000 ug | INTRAMUSCULAR | Status: DC | PRN
  Administered 2015-07-25 (×5): 25 ug via INTRAVENOUS

## 2015-07-25 MED ORDER — DEXTROSE 5 % IV SOLN
1.5000 g | Freq: Two times a day (BID) | INTRAVENOUS | Status: AC
  Administered 2015-07-25 – 2015-07-26 (×2): 1.5 g via INTRAVENOUS
  Filled 2015-07-25 (×2): qty 1.5

## 2015-07-25 MED ORDER — LACTATED RINGERS IV SOLN
INTRAVENOUS | Status: DC
  Administered 2015-07-25: 10:00:00 via INTRAVENOUS

## 2015-07-25 MED ORDER — OXYCODONE HCL 5 MG/5ML PO SOLN: 5.0000 mg | Freq: Once | ORAL | Status: DC | PRN

## 2015-07-25 MED ORDER — OXYCODONE HCL 5 MG PO TABS: 5.0000 mg | ORAL_TABLET | ORAL | Status: DC | PRN

## 2015-07-25 MED ORDER — LEVOTHYROXINE SODIUM 112 MCG PO TABS
112.0000 ug | ORAL_TABLET | Freq: Every day | ORAL | Status: DC
  Administered 2015-07-26 – 2015-07-28 (×3): 112 ug via ORAL
  Filled 2015-07-25 (×3): qty 1

## 2015-07-25 SURGICAL SUPPLY — 62 items
ADHESIVE MASTISOL STRL (MISCELLANEOUS) ×2 IMPLANT
BENZOIN TINCTURE PRP APPL 2/3 (GAUZE/BANDAGES/DRESSINGS) ×2 IMPLANT
BNDG COHESIVE 4X5 TAN STRL (GAUZE/BANDAGES/DRESSINGS) IMPLANT
BRONCHOSCOPE PED SLIM DISP (MISCELLANEOUS) ×2 IMPLANT
CANISTER SUCT 1200ML W/VALVE (MISCELLANEOUS) ×2 IMPLANT
CATH THOR STR 32F 8032 SOFT WA (CATHETERS) ×2 IMPLANT
CATH TRAY 16F METER LATEX (MISCELLANEOUS) ×2 IMPLANT
CATH URET ROBINSON 16FR STRL (CATHETERS) IMPLANT
CHLORAPREP W/TINT 26ML (MISCELLANEOUS) ×8 IMPLANT
CNTNR SPEC 2.5X3XGRAD LEK (MISCELLANEOUS) ×1
CONT SPEC 4OZ STER OR WHT (MISCELLANEOUS) ×1
CONTAINER SPEC 2.5X3XGRAD LEK (MISCELLANEOUS) ×1 IMPLANT
CUTTER ECHEON FLEX ENDO 45 340 (ENDOMECHANICALS) ×2 IMPLANT
DEFOGGER SCOPE WARMER CLEARIFY (MISCELLANEOUS) ×2 IMPLANT
DRAIN CHEST DRY SUCT SGL (MISCELLANEOUS) ×2 IMPLANT
DRAPE C-SECTION (MISCELLANEOUS) ×4 IMPLANT
DRAPE MAG INST 16X20 L/F (DRAPES) ×2 IMPLANT
DRAPE SHEET LG 3/4 BI-LAMINATE (DRAPES) ×2 IMPLANT
DRSG OPSITE POSTOP 3X4 (GAUZE/BANDAGES/DRESSINGS) ×6 IMPLANT
DRSG OPSITE POSTOP 4X6 (GAUZE/BANDAGES/DRESSINGS) IMPLANT
DRSG OPSITE POSTOP 4X8 (GAUZE/BANDAGES/DRESSINGS) IMPLANT
DRSG TELFA 3X8 NADH (GAUZE/BANDAGES/DRESSINGS) ×2 IMPLANT
ELECT BLADE 6.5 EXT (BLADE) IMPLANT
ELECT CAUTERY BLADE TIP 2.5 (TIP) ×2
ELECT REM PT RETURN 9FT ADLT (ELECTROSURGICAL) ×2
ELECTRODE CAUTERY BLDE TIP 2.5 (TIP) ×1 IMPLANT
ELECTRODE REM PT RTRN 9FT ADLT (ELECTROSURGICAL) ×1 IMPLANT
GAUZE SPONGE 4X4 12PLY STRL (GAUZE/BANDAGES/DRESSINGS) ×2 IMPLANT
GLOVE SURG SYN 7.5  E (GLOVE) ×2
GLOVE SURG SYN 7.5 E (GLOVE) ×2 IMPLANT
GOWN STRL REUS W/ TWL LRG LVL3 (GOWN DISPOSABLE) ×5 IMPLANT
GOWN STRL REUS W/TWL LRG LVL3 (GOWN DISPOSABLE) ×5
KIT RM TURNOVER STRD PROC AR (KITS) ×2 IMPLANT
LABEL OR SOLS (LABEL) ×2 IMPLANT
LOOP RED MAXI  1X406MM (MISCELLANEOUS) ×1
LOOP VESSEL MAXI 1X406 RED (MISCELLANEOUS) ×1 IMPLANT
MARKER SKIN DUAL TIP RULER LAB (MISCELLANEOUS) ×2 IMPLANT
PACK BASIN MAJOR ARMC (MISCELLANEOUS) ×2 IMPLANT
RELOAD GOLD ECHELON 45 (STAPLE) ×4 IMPLANT
RELOAD STAPLER LINE PROX 30 GR (STAPLE) IMPLANT
SPONGE KITTNER 5P (MISCELLANEOUS) ×2 IMPLANT
STAPLER RELOAD LINE PROX 30 GR (STAPLE)
STAPLER SKIN PROX 35W (STAPLE) IMPLANT
STAPLER VASCULAR ECHELON 35 (CUTTER) IMPLANT
STRIP CLOSURE SKIN 1/2X4 (GAUZE/BANDAGES/DRESSINGS) ×2 IMPLANT
SUT MNCRL AB 3-0 PS2 27 (SUTURE) ×4 IMPLANT
SUT SILK 0 (SUTURE) ×1
SUT SILK 0 30XBRD TIE 6 (SUTURE) ×1 IMPLANT
SUT SILK 1 SH (SUTURE) ×12 IMPLANT
SUT VIC AB 0 CT1 36 (SUTURE) ×2 IMPLANT
SUT VIC AB 2-0 CT1 27 (SUTURE) ×3
SUT VIC AB 2-0 CT1 TAPERPNT 27 (SUTURE) ×3 IMPLANT
SUT VICRYL 2 TP 1 (SUTURE) IMPLANT
SYR 10ML SLIP (SYRINGE) ×2 IMPLANT
SYR BULB IRRIG 60ML STRL (SYRINGE) ×2 IMPLANT
TAPE ADH 3 LX (MISCELLANEOUS) ×2 IMPLANT
TAPE TRANSPORE STRL 2 31045 (GAUZE/BANDAGES/DRESSINGS) ×2 IMPLANT
TROCAR FLEXIPATH 20X80 (ENDOMECHANICALS) ×4 IMPLANT
TROCAR FLEXIPATH THORACIC 15MM (ENDOMECHANICALS) IMPLANT
TUBING CONNECTING 10 (TUBING) ×2 IMPLANT
WATER STERILE IRR 1000ML POUR (IV SOLUTION) ×4 IMPLANT
YANKAUER SUCT BULB TIP FLEX NO (MISCELLANEOUS) ×2 IMPLANT

## 2015-07-25 NOTE — Anesthesia Preprocedure Evaluation (Signed)
Anesthesia Evaluation  Patient identified by MRN, date of birth, ID band Patient awake    Reviewed: Allergy & Precautions, H&P , NPO status , Patient's Chart, lab work & pertinent test results  History of Anesthesia Complications Negative for: history of anesthetic complications  Airway Mallampati: II  TM Distance: >3 FB Neck ROM: full    Dental  (+) Poor Dentition, Chipped   Pulmonary neg shortness of breath, asthma ,    Pulmonary exam normal breath sounds clear to auscultation       Cardiovascular Exercise Tolerance: Good hypertension, (-) angina(-) Past MI and (-) DOE Normal cardiovascular exam Rhythm:regular Rate:Normal     Neuro/Psych negative neurological ROS  negative psych ROS   GI/Hepatic negative GI ROS, Neg liver ROS, neg GERD  ,  Endo/Other  Hypothyroidism   Renal/GU negative Renal ROS  negative genitourinary   Musculoskeletal   Abdominal   Peds  Hematology negative hematology ROS (+)   Anesthesia Other Findings Past Medical History:   Cancer ([place])                                                   Comment:thyroid   Hypertension                                                 Thyroid disease                                              Hypothyroidism                                              Past Surgical History:   ABDOMINAL HYSTERECTOMY                                        TUBAL LIGATION                                                THYROIDECTOMY                                                   Reproductive/Obstetrics negative OB ROS                             Anesthesia Physical Anesthesia Plan  ASA: III  Anesthesia Plan: General ETT   Post-op Pain Management:    Induction:   Airway Management Planned: Double Lumen EBT  Additional Equipment:   Intra-op Plan:   Post-operative Plan:   Informed Consent: I have reviewed the patients History and  Physical,  chart, labs and discussed the procedure including the risks, benefits and alternatives for the proposed anesthesia with the patient or authorized representative who has indicated his/her understanding and acceptance.   Dental Advisory Given  Plan Discussed with: Anesthesiologist, CRNA and Surgeon  Anesthesia Plan Comments:         Anesthesia Quick Evaluation

## 2015-07-25 NOTE — Transfer of Care (Signed)
Immediate Anesthesia Transfer of Care Note  Patient: Brenda Mckinney  Procedure(s) Performed: Procedure(s): RIGHT THOROCOSCOPY REMOVAL RIGHT LOWER LOBE MASS, PREOP BRONCHOSCOPY (Right)  Patient Location: PACU  Anesthesia Type:General  Level of Consciousness: awake and responds to stimulation  Airway & Oxygen Therapy: Patient Spontanous Breathing and Patient connected to face mask oxygen  Post-op Assessment: Report given to RN and Post -op Vital signs reviewed and stable  Post vital signs: Reviewed and stable  Last Vitals:  Filed Vitals:   07/25/15 1148 07/25/15 1151  BP: 159/96 159/96  Pulse: 65 65  Temp: 36.3 C   Resp: 24 16    Last Pain: There were no vitals filed for this visit.       Complications: No apparent anesthesia complications

## 2015-07-25 NOTE — Anesthesia Procedure Notes (Signed)
Procedure Name: Intubation Performed by: Lance Muss Pre-anesthesia Checklist: Patient identified, Emergency Drugs available, Suction available, Patient being monitored and Timeout performed Patient Re-evaluated:Patient Re-evaluated prior to inductionOxygen Delivery Method: Circle system utilized Preoxygenation: Pre-oxygenation with 100% oxygen Intubation Type: IV induction Ventilation: Mask ventilation without difficulty Laryngoscope Size: Mac and 3 Grade View: Grade I Tube type: Oral Endobronchial tube: Double lumen EBT, EBT position confirmed by auscultation and EBT position confirmed by fiberoptic bronchoscope and 37 Fr Number of attempts: 1 Airway Equipment and Method: Stylet Placement Confirmation: ETT inserted through vocal cords under direct vision,  positive ETCO2 and breath sounds checked- equal and bilateral Secured at: 27 cm Tube secured with: Tape Dental Injury: Teeth and Oropharynx as per pre-operative assessment

## 2015-07-25 NOTE — Progress Notes (Addendum)
Per Dr. Genevive Bi discontinue order for dulcolax. Okay to give blood pressure meds. Also okay per MD to place order for clear liquid diet and advance as tolerated

## 2015-07-25 NOTE — Progress Notes (Signed)
Notified Dr. Genevive Bi that patient is still experiencing pain. Per MD discontinue order for oxycodone and place order for percocet 1-2 tablets q 4 hours prn. Then uses morhpine for breakthrough pain

## 2015-07-25 NOTE — Op Note (Signed)
  07/25/2015  11:52 AM  PATIENT:  Brenda Mckinney  55 y.o. 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 Oliver Pila  ANESTHESIA: Gen.  INDICATIONS FOR PROCEDURE this is a 55 year old female with a prior history of thyroid cancer who's had an enlarging right lower lobe mass. In 2011 she was found to have a right lower lobe mass that had slightly increased in size over the years and was slightly PET-positive raising the possibility of this being a metastatic lesion or a primary lung cancer. Because of this uncertainty she was offered the above named procedure for definitive diagnosis and treatment.  DICTATION: Patient was brought to the operating suite and placed in supine position. General endotracheal anesthesia was given with a double-lumen tube. Preoperative bronchoscopy was carried out and was normal to the subsegmental levels bilaterally. The endotracheal tube was positioned within the left mainstem bronchus. The patient was then turned for a right thoracoscopy. All pressure points were carefully padded. The patient was prepped and draped in usual sterile fashion.  We began by making a small incision in the fifth intercostal space in the anterior axillary line. The incision was deepened down through the muscles of the chest wall until the pleural space was entered. Upon entering the pleural space we could then visualize our 2 other ports. These were created inferiorly and posteriorly. We could also visualize the mass within the right lower lobe posteriorly. All port sites were approximately 20 mm long. A finger was placed through the  most posterior port and the lung was brought up to the palpating finger. This confirmed the diagnosis of the lung mass. It was then grasped with a lung clamp and elevated. An endoscopic stapler was used to excise the mass from the right lower lobe. It was then placed in a glove and removed from the most anterior port site. All port sites were then inspected and found to be hemostatic. The lung was hemostatic. A single 53 French chest tube was inserted through her most anterior port site and brought out through a separate stab wound. The muscles were then closed with 0 Vicryl. The subcutaneous tissues with 3-0 Vicryl and the skin with 4-0 absorbable suture. Frozen section returned a smooth muscle tumor mixed with proctoscopy epithelium consistent with a hamartoma. There is no evidence of malignancy and the margins were negative. At that point we elected to finish the case. Sterile dressings were then applied. The patient was rolled in the supine position where she was then extubated and taken to the recovery room in stable condition.   Nestor Lewandowsky, MD

## 2015-07-26 LAB — TYPE AND SCREEN
ABO/RH(D): O NEG
Antibody Screen: NEGATIVE
UNIT DIVISION: 0
Unit division: 0

## 2015-07-26 LAB — SURGICAL PATHOLOGY

## 2015-07-26 LAB — PREPARE RBC (CROSSMATCH)

## 2015-07-26 MED ORDER — ALBUTEROL SULFATE (2.5 MG/3ML) 0.083% IN NEBU
2.5000 mg | INHALATION_SOLUTION | Freq: Two times a day (BID) | RESPIRATORY_TRACT | Status: DC
  Administered 2015-07-26 – 2015-07-28 (×4): 2.5 mg via RESPIRATORY_TRACT
  Filled 2015-07-26 (×4): qty 3

## 2015-07-26 NOTE — Evaluation (Signed)
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 Pt admitted for R thoracoscopy with wedge resection of mass.   Clinical Impression  Pt is a pleasant 55 year old female who was admitted for R thoracoscopy with wedge resection of lung mass. Pt performs bed mobility, transfers, and ambulation with cga and rw. All mobility performed on 2L of O2 with sats WNL. Pt demonstrates deficits with endurance/pain/mobility. Would benefit from skilled PT to address above deficits and promote optimal return to PLOF. Pt very motivated to perform therapy.      Follow Up Recommendations No PT follow up    Equipment Recommendations  Rolling walker with 5" wheels    Recommendations for Other Services       Precautions / Restrictions Precautions Precautions: Fall Restrictions Weight Bearing Restrictions: No      Mobility  Bed Mobility Overal bed mobility: Needs Assistance Bed Mobility: Supine to Sit     Supine to sit: Min guard     General bed mobility comments: Pt does well with limited assistance and cues for sequencing. Once seated at EOB, pt took a break secondary to pain. Upright posture noted  Transfers Overall transfer level: Needs assistance Equipment used: Rolling walker (2 wheeled) Transfers: Sit to/from Stand Sit to Stand: Min guard         General transfer comment: transfers performed with RW with safe technique. +2 required for equipment  Ambulation/Gait Ambulation/Gait assistance: Min guard Ambulation Distance (Feet): 80 Feet Assistive device: Rolling walker (2 wheeled) Gait Pattern/deviations: Step-through pattern     General Gait Details: ambulated using rw and cga, progressing to supervision. Reciprocal gait pattern performed. Pt demonstrates slightly flexed posture secondary to pain. All mobility performed while on  2L of O2.  Stairs            Wheelchair Mobility    Modified Rankin (Stroke Patients Only)       Balance Overall balance assessment: Needs assistance Sitting-balance support: Feet supported Sitting balance-Leahy Scale: Normal     Standing balance support: Bilateral upper extremity supported Standing balance-Leahy Scale: Good                               Pertinent Vitals/Pain Pain Assessment: 0-10 Pain Score: 6  Pain Location: R side of body Pain Descriptors / Indicators: Dull;Discomfort Pain Intervention(s): Limited activity within patient's tolerance;Repositioned    Home Living Family/patient expects to be discharged to:: Private residence Living Arrangements: Spouse/significant other Available Help at Discharge: Family Type of Home: House Home Access: Stairs to enter Entrance Stairs-Rails: Left Entrance Stairs-Number of Steps: 3 Home Layout: Able to live on main level with bedroom/bathroom Home Equipment: None      Prior Function Level of Independence: Independent         Comments: still working as Biochemist, clinical        Extremity/Trunk Assessment   Upper Extremity Assessment: Generalized weakness (B UE grossly 4/5)           Lower Extremity Assessment: Generalized weakness (B LE grossly 4/5)         Communication   Communication: No difficulties  Cognition Arousal/Alertness: Awake/alert Behavior During Therapy: WFL for tasks assessed/performed Overall Cognitive Status: Within Functional Limits for tasks assessed  General Comments      Exercises Other Exercises Other Exercises: Pt reports being fatigued from previous walk with Dr. Genevive Bi. Defer therex at this time      Assessment/Plan    PT Assessment Patient needs continued PT services  PT Diagnosis Difficulty walking;Generalized weakness;Acute pain   PT Problem List Decreased strength;Decreased activity  tolerance;Decreased mobility  PT Treatment Interventions DME instruction;Gait training;Therapeutic exercise   PT Goals (Current goals can be found in the Care Plan section) Acute Rehab PT Goals Patient Stated Goal: to go home PT Goal Formulation: With patient Time For Goal Achievement: 08/09/15 Potential to Achieve Goals: Good    Frequency Min 2X/week   Barriers to discharge        Co-evaluation               End of Session Equipment Utilized During Treatment: Oxygen Activity Tolerance: Patient limited by fatigue;Patient limited by pain Patient left: in bed Nurse Communication: Mobility status         Time: [id number] PT Time Calculation (min) (ACUTE ONLY): 23 min   Charges:   PT Evaluation $PT Eval Moderate Complexity: 1 Procedure     PT G Codes:        Torry Adamczak 07/27/15, 3:08 PM  Greggory Stallion, PT, DPT [phone]

## 2015-07-26 NOTE — Anesthesia Postprocedure Evaluation (Signed)
Anesthesia Post Note  Patient: Brenda Mckinney  Procedure(s) Performed: Procedure(s) (LRB): RIGHT THOROCOSCOPY REMOVAL RIGHT LOWER LOBE MASS, PREOP BRONCHOSCOPY (Right)  Patient location during evaluation: PACU Anesthesia Type: General Level of consciousness: awake and alert Pain management: pain level controlled Vital Signs Assessment: post-procedure vital signs reviewed and stable Respiratory status: spontaneous breathing, nonlabored ventilation, respiratory function stable and patient connected to nasal cannula oxygen Cardiovascular status: blood pressure returned to baseline and stable Postop Assessment: no signs of nausea or vomiting Anesthetic complications: no    Last Vitals:  Filed Vitals:   07/26/15 1553 07/26/15 1646  BP:    Pulse: 59 60  Temp:    Resp:      Last Pain:  Filed Vitals:   07/26/15 1648  PainSc: 6                  Broadus John K Piscitello

## 2015-07-26 NOTE — Progress Notes (Signed)
Per Dr. Genevive Bi discontinue foley, discontinue IV fluids, discontinue IV morphine, place chest tube to water seal, Regular diet, and PT evaluation

## 2015-07-26 NOTE — Progress Notes (Signed)
No show

## 2015-07-26 NOTE — Care Management (Signed)
Family is requesting prescription for rolling walker and potty chair.

## 2015-07-27 ENCOUNTER — Telehealth: Payer: Self-pay

## 2015-07-27 NOTE — Progress Notes (Signed)
Had a good night.  Pain under good control.  Ate well.  Walking in halls without pain  Chest tube had minimal drainage.  No air leak  Wounds clean and dry.  Lungs clear.  Path is hamartoma.  Will check CXRay in the morning and probable discharge if [employer]

## 2015-07-27 NOTE — Care Management (Signed)
Chest tube to water seal.  Patient requiring acute O2 at this time. Qualifying sats documented.  PT has recommended RW at time of discharge.

## 2015-07-27 NOTE — Telephone Encounter (Signed)
Called Mr. Earnstine Regal, patient's husband to let him know that his wife's disability form was filled out and ready for pick up. He stated that he would pass by later on today.

## 2015-07-27 NOTE — Progress Notes (Signed)
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 Pt admitted for R thoracoscopy with wedge resection of mass.     PT Comments    Pt is making good progress towards goals with increased mobility this date. Pt still requires use of RW at this time. O2 sats at rest on room air at 88%, therefore ambulated pt with 1L of O2 applied. Sats WNL. Pt agreeable to therapy, however fatigues with increased ambulation. Slow gait speed noted. At end of session, pt left in bathroom with aide to wash up.   Follow Up Recommendations  No PT follow up     Equipment Recommendations  Rolling walker with 5" wheels    Recommendations for Other Services       Precautions / Restrictions Precautions Precautions: Fall Restrictions Weight Bearing Restrictions: No    Mobility  Bed Mobility Overal bed mobility: Needs Assistance Bed Mobility: Supine to Sit     Supine to sit: Supervision     General bed mobility comments: Improved technique with limited assistance for cues. Once seated at EOB, pt able to sit with upright posture.  Transfers Overall transfer level: Modified independent Equipment used: Rolling walker (2 wheeled) Transfers: Sit to/from Stand Sit to Stand: Supervision         General transfer comment: transfers performed with RW with safe technique. +2 required for equipment  Ambulation/Gait Ambulation/Gait assistance: Supervision Ambulation Distance (Feet): 160 Feet Assistive device: Rolling walker (2 wheeled) Gait Pattern/deviations: Step-through pattern     General Gait Details: ambulated using supervision and rw. Pt fatigues with increased endurance. Reciprocal gait pattern performed. All mobility performed on 1L of O2 with sats at 90%.   Stairs            Wheelchair Mobility    Modified Rankin (Stroke Patients  Only)       Balance                                    Cognition Arousal/Alertness: Awake/alert Behavior During Therapy: WFL for tasks assessed/performed Overall Cognitive Status: Within Functional Limits for tasks assessed                      Exercises      General Comments        Pertinent Vitals/Pain Pain Assessment: Faces Faces Pain Scale: Hurts little more Pain Location: R chest Pain Descriptors / Indicators: Operative site guarding Pain Intervention(s): Premedicated before session    Home Living                      Prior Function            PT Goals (current goals can now be found in the care plan section) Acute Rehab PT Goals Patient Stated Goal: to go home PT Goal Formulation: With patient Time For Goal Achievement: 08/09/15 Potential to Achieve Goals: Good Progress towards PT goals: Progressing toward goals    Frequency  Min 2X/week    PT Plan Current plan remains appropriate    Co-evaluation             End of Session Equipment Utilized During Treatment: Oxygen Activity Tolerance: Patient limited by fatigue;Patient limited by pain Patient left:  (in bathroom with aide)  Time: 0940-1003 PT Time Calculation (min) (ACUTE ONLY): 23 min  Charges:  $Gait Training: 23-37 mins                    G Codes:     Greggory Stallion, PT, DPT [phone]  Brenda Mckinney 07/27/2015, 4:27 PM

## 2015-07-27 NOTE — Care Management (Signed)
SATURATION QUALIFICATIONS: (This note is used to comply with regulatory documentation for home oxygen)    Per PT  Patient Saturations on Room Air at Rest = 88%   Please briefly explain why patient needs home oxygen:Patient was place on 1 liter of O2 to maintain oxygen on 90%

## 2015-07-28 ENCOUNTER — Inpatient Hospital Stay: Payer: BLUE CROSS/BLUE SHIELD

## 2015-07-28 NOTE — Care Management (Signed)
Chest tubes has been removed. Patient has been reassessed and does not qualify for home oxygen.  Order has been placed for RW.  Manuela Schwartz with [facility] has been notified and walker will be delivered prior to discharge. RNCM signing off

## 2015-07-28 NOTE — Progress Notes (Signed)
Pt to be discharged per MD order. IV removed. Instructions reviewed with pt and all questions were answered. Scripts given to pt

## 2015-07-29 ENCOUNTER — Ambulatory Visit: Payer: Self-pay | Admitting: Cardiothoracic Surgery

## 2015-07-29 NOTE — Discharge Summary (Signed)
Physician Discharge Summary  Patient ID: Brenda Mckinney MRN: [id number] DOB/AGE: 57-Mar-1962 57 y.o.  Admit date: 07/25/2015 Discharge date: 07/29/2015   Discharge Diagnoses:  Active Problems:   Hamartoma of lung   Procedures:right thoracoscopy with wedge resection of RLL mass  Hospital Course: She was taken to the OR and underwent VATS wedge of RLL mass with frozen section showing hamartoma.  She did well postop and had her chest tube removed on POD #3.  Her wounds were healing as expected and final path did show hamartoma.    Disposition: 01-Home or Self Care  Discharge Instructions    Diet - low sodium heart healthy    Complete by:  As directed      Discharge instructions    Complete by:  As directed   Remove dressing in 48 hours and then wash over all wounds with soap and water.  Gently pat dry.  Please call Dr. Genevive Bi' office to make appointment in 10 days.  You will need a chest xray prior to the appointment.  Call the office for wound drainage, fever over 100.0 and unusual pain or shortness of breath.     Increase activity slowly    Complete by:  As directed      Suture removal    Complete by:  As directed             Medication List    STOP taking these medications        ibuprofen 200 MG tablet  Commonly known as:  ADVIL,MOTRIN     potassium chloride SA 20 MEQ tablet  Commonly known as:  K-DUR,KLOR-CON      TAKE these medications        Albuterol Sulfate 108 (90 Base) MCG/ACT Aepb  Inhale 2 puffs into the lungs every 4 (four) hours as needed (shortness of breath or wheezing).     amLODipine 5 MG tablet  Commonly known as:  NORVASC  Take 5 mg by mouth daily.     fluticasone 50 MCG/ACT nasal spray  Commonly known as:  FLONASE  Place 1 spray into the nose daily as needed for allergies.     Fluticasone-Salmeterol 250-50 MCG/DOSE Aepb  Commonly known as:  ADVAIR  Inhale 1 puff into the lungs 2 (two) times daily.     levothyroxine 112 MCG tablet  Commonly  known as:  SYNTHROID, LEVOTHROID  Take 112 mcg by mouth daily before breakfast.     lisinopril-hydrochlorothiazide 20-25 MG tablet  Commonly known as:  PRINZIDE,ZESTORETIC  Take 1 tablet by mouth daily.     metoprolol tartrate 25 MG tablet  Commonly known as:  LOPRESSOR  Take 25 mg by mouth 2 (two) times daily.     simvastatin 40 MG tablet  Commonly known as:  ZOCOR  Take 40 mg by mouth daily.         Nestor Lewandowsky, MD

## 2015-08-03 ENCOUNTER — Telehealth: Payer: Self-pay | Admitting: Cardiothoracic Surgery

## 2015-08-03 DIAGNOSIS — R918 Other nonspecific abnormal finding of lung field: Secondary | ICD-10-CM

## 2015-08-03 NOTE — Telephone Encounter (Signed)
Patient has a post op appointment on 08/09/15 with Dr Genevive Bi for a Preoperative bronchoscopy; right thoracoscopy with wedge resection right lower lobe mass. Please order a chest x ray for the patient. I have advised her to have this done prior to her coming to her appointment at 11:00 am.  Patient would like to be called back to discuss her constipation that she has had since her surgery. Phone number in chart has been verified.

## 2015-08-03 NOTE — Telephone Encounter (Signed)
Chest X-ray order placed.   Called patient to review the following information. No answer. Left Voicemail asking for return phone call.  Patient should increase water intake and activity level as much as possible to help with bowel movement. They may use Miralax over the counter x 2 doses, 6 hours apart, followed by Dulcolax x 2 doses, 6 hours apart until they have a successful bowel movement. If they are unsuccessful with oral medications, they will need to do 2 fleets enemas back to back. Patient should call back for further instructions if after taking all doses of these medications, they are still unsuccessful with a bowel movement.

## 2015-08-09 ENCOUNTER — Encounter: Payer: Self-pay | Admitting: Cardiothoracic Surgery

## 2015-08-09 ENCOUNTER — Ambulatory Visit (INDEPENDENT_AMBULATORY_CARE_PROVIDER_SITE_OTHER): Payer: BLUE CROSS/BLUE SHIELD | Admitting: Cardiothoracic Surgery

## 2015-08-09 ENCOUNTER — Ambulatory Visit
Admission: RE | Admit: 2015-08-09 | Discharge: 2015-08-09 | Disposition: A | Discharge status: Home / Self Care | Payer: BLUE CROSS/BLUE SHIELD | Source: Ambulatory Visit | Attending: Cardiothoracic Surgery | Admitting: Cardiothoracic Surgery

## 2015-08-09 VITALS — BP 144/87 | HR 50 | Temp 98.3°F | Wt 205.0 lb

## 2015-08-09 DIAGNOSIS — Q858 Other phakomatoses, not elsewhere classified: Secondary | ICD-10-CM | POA: Insufficient documentation

## 2015-08-09 DIAGNOSIS — Q859 Phakomatosis, unspecified: Secondary | ICD-10-CM

## 2015-08-09 DIAGNOSIS — R918 Other nonspecific abnormal finding of lung field: Secondary | ICD-10-CM

## 2015-08-09 DIAGNOSIS — Z9889 Other specified postprocedural states: Secondary | ICD-10-CM | POA: Diagnosis not present

## 2015-08-09 NOTE — Patient Instructions (Signed)
We will refer you to see Dr. Lisette Grinder for your right knee.  We will see you in two weeks.

## 2015-08-09 NOTE — Progress Notes (Signed)
She returns today in follow-up. She states that her cough is been improved. She has no fevers or chills. No significant pain issues. She does have some pain around her right breast.  Her thoracoscopy wounds are well healed. We did remove her single suture. She did have a chest x-ray made which have independently reviewed. I see no problems with that.  I will see her back again in 2 weeks' time.

## 2015-08-10 ENCOUNTER — Telehealth: Payer: Self-pay | Admitting: Cardiothoracic Surgery

## 2015-08-10 NOTE — Telephone Encounter (Signed)
Referral for right knee pain has been submitted to [facility]. Appointment has been made for 08/15/15 with Dr Tommy Medal @ 2:30pm. Location[facility]. NO answer when I called patient. I have left a message for patient to call back to obtain appointment information. If patient needs to reschedule she should call [phone].

## 2015-08-11 NOTE — Telephone Encounter (Signed)
Mrs Brenda Mckinney called and was given her appointment information with Dr Lisette Grinder (2:30pm on 08/15/15). She said that day was not going to work for her as her husband will be out of town.  She will be calling to change the appointment date.

## 2015-08-18 ENCOUNTER — Telehealth: Payer: Self-pay

## 2015-08-18 NOTE — Telephone Encounter (Signed)
Crystal from [employer] called in regards to the patient. She wanted to know the surgery that was performed, which was a Right Thoracoscopy removal, Right lower lobe mass, Pre op Bronchoscopy. She also needs to know when the patient can return back to work and what her Diagnosis code is. I will route this message to a nurse and have them follow up with Crystal. A good contact number for her is [phone]

## 2015-08-25 ENCOUNTER — Telehealth: Payer: Self-pay

## 2015-08-25 NOTE — Telephone Encounter (Signed)
Called patient and left her a voicemail to return my call. I want to ask her if she had returned to work or if not, when will she return.  Then I will need to call her insurance company to let them know.

## 2015-08-26 ENCOUNTER — Ambulatory Visit (INDEPENDENT_AMBULATORY_CARE_PROVIDER_SITE_OTHER): Payer: BLUE CROSS/BLUE SHIELD | Admitting: Cardiothoracic Surgery

## 2015-08-26 ENCOUNTER — Encounter: Payer: Self-pay | Admitting: Cardiothoracic Surgery

## 2015-08-26 VITALS — BP 149/90 | HR 58 | Temp 98.3°F | Wt 206.0 lb

## 2015-08-26 DIAGNOSIS — Q859 Phakomatosis, unspecified: Secondary | ICD-10-CM

## 2015-08-26 NOTE — Patient Instructions (Signed)
Please give us a call if you have any questions or concerns. 

## 2015-08-26 NOTE — Progress Notes (Signed)
[facility] Post-[facility] Note  Patient ID: Brenda Mckinney, female   DOB: March 06, 1960, 55 y.o.   MRN: [id number]  HISTORY: He returns today in follow-up. She states that her pain has been gradually improving. She only rarely uses the tramadol. She would like to get back to work. She feels that she could accomplish that.   Vitals:   08/26/15 0908  BP: (!) 149/90  Pulse: (!) 58  Temp: 98.3 F (36.8 C)     EXAM: Resp: Lungs are clear bilaterally.  No respiratory distress, normal effort. Heart:  Regular without murmurs Her thoracoscopy wounds are all healing.  Neurological: Alert and oriented to person, place, and time. Coordination normal.  Skin: Skin is warm and dry. No rash noted. No diaphoretic. No erythema. No pallor.  Psychiatric: Normal mood and affect. Normal behavior. Judgment and thought content normal.    ASSESSMENT: The final pathology did reveal a hamartoma. I did counsel her about which she is able to do at work. We will work with her to fill out the necessary paperwork to get her back to her current position. She will call me and follow-up with me as needed.   PLAN:   Follow-up when necessary.    Nestor Lewandowsky, MD

## 2015-08-30 NOTE — Telephone Encounter (Signed)
Called Chrystal from [employer] at [phone] and had to leave a voicemail.

## 2015-09-02 ENCOUNTER — Telehealth: Payer: Self-pay

## 2015-09-02 NOTE — Telephone Encounter (Signed)
Patient's pharmacist called stating that patient is requesting a refill on her tramadol. I told her that Dr. Genevive Bi did not give her refills for this medication. However, I told pharmacist that patient's surgery was on 07/25/2015 and that she should be fine taking Ibuprofen 600 MG every 6 hours as needed. I also told the pharmacist that if patient wanted me to ask Dr. Genevive Bi for a refill than that I would. Patient replied that she would try taking Ibuprofen but if it wouldn't help then she would call us back. I told her that we would be here if she needed [place].

## 2015-11-22 ENCOUNTER — Other Ambulatory Visit: Payer: Self-pay | Admitting: Internal Medicine

## 2015-11-22 DIAGNOSIS — Z1231 Encounter for screening mammogram for malignant neoplasm of breast: Secondary | ICD-10-CM

## 2015-12-28 ENCOUNTER — Ambulatory Visit
Admission: RE | Admit: 2015-12-28 | Discharge: 2015-12-28 | Disposition: A | Discharge status: Home / Self Care | Payer: BLUE CROSS/BLUE SHIELD | Source: Ambulatory Visit | Attending: Internal Medicine | Admitting: Internal Medicine

## 2015-12-28 DIAGNOSIS — Z1231 Encounter for screening mammogram for malignant neoplasm of breast: Secondary | ICD-10-CM | POA: Insufficient documentation

## 2016-12-17 ENCOUNTER — Other Ambulatory Visit: Payer: Self-pay | Admitting: Internal Medicine

## 2016-12-17 DIAGNOSIS — Z1239 Encounter for other screening for malignant neoplasm of breast: Secondary | ICD-10-CM

## 2017-01-18 ENCOUNTER — Ambulatory Visit
Admission: RE | Admit: 2017-01-18 | Discharge: 2017-01-18 | Disposition: A | Discharge status: Home / Self Care | Payer: BLUE CROSS/BLUE SHIELD | Source: Ambulatory Visit | Attending: Internal Medicine | Admitting: Internal Medicine

## 2017-01-18 DIAGNOSIS — Z1239 Encounter for other screening for malignant neoplasm of breast: Secondary | ICD-10-CM

## 2017-01-18 DIAGNOSIS — Z1231 Encounter for screening mammogram for malignant neoplasm of breast: Secondary | ICD-10-CM | POA: Diagnosis not present

## 2017-07-18 IMAGING — CT NM PET TUM IMG INITIAL (PI) SKULL BASE T - THIGH
1 of 10 series · 1 of 25 positions shown · non-contrast
Comparison: CT 02/23/2015

CLINICAL DATA: Initial all Treatment strategy for at the RIGHT
lower lobe lung nodule.

EXAM:
NUCLEAR MEDICINE PET SKULL BASE TO THIGH
TECHNIQUE: 12.6 mCi F-18 FDG was injected intravenously. Full-ring PET imaging
was performed from the skull base to thigh after the radiotracer. CT
data was obtained and used for attenuation correction and anatomic
localization.
FASTING BLOOD GLUCOSE:  Value: 106 mg/dl

[Series 4: ct wb 5.0 b30f · axial · 5.0mm · 0.98mm/px · 1 of 290 slices shown]
[im 290/290  brain]
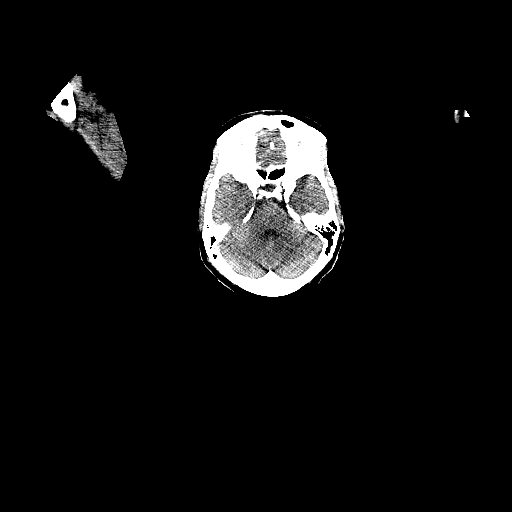

[1 of 25 positions shown; findings below may reference images not displayed]

FINDINGS: NECK Set

No hypermetabolic nodule lymph nodes in the neck. Physiologic
activity noted in the pharyngeal mucosa. Hypermetabolic activity in
the hypopharynx include the arytenoid cartilage / musculature.

CHEST

Within the RIGHT lower lobe 14 mm nodule (image 104, series 4) has
mild to moderate metabolic activity with SUV max 2.4. No additional
pulmonary nodules.

No hypermetabolic mediastinal lymph nodes.

ABDOMEN/PELVIS

No abnormal hypermetabolic activity within the liver, pancreas,
adrenal glands, or spleen. No hypermetabolic lymph nodes in the
abdomen or pelvis.

SKELETON

No focal hypermetabolic activity to suggest skeletal metastasis.
IMPRESSION: 1. Mild to moderate metabolic activity associated with RIGHT lower
lobe pulmonary nodule. Findings are indeterminate for carcinoma.
Recommend percutaneous biopsy.
2. No evidence of mediastinal metastasis or distant metastasis
3. Metabolic activity in the pharyngeal mucosa and hypopharynx is
favored physiologic.

## 2017-11-12 IMAGING — CR DG CHEST 2V
1 series · 2 of 2 positions shown · non-contrast
Comparison: PET-CT 03/23/2015 and earlier, including [REDACTED] chest radiographs 02/09/2015.

CLINICAL DATA: 55-year-old female preoperative study for lung
nodule surgery. Initial encounter.

EXAM:
CHEST  2 VIEW

[Series 1: dg chest 2 view · 0.14mm/px · 2 of 2 slices shown]
[im 1/2]
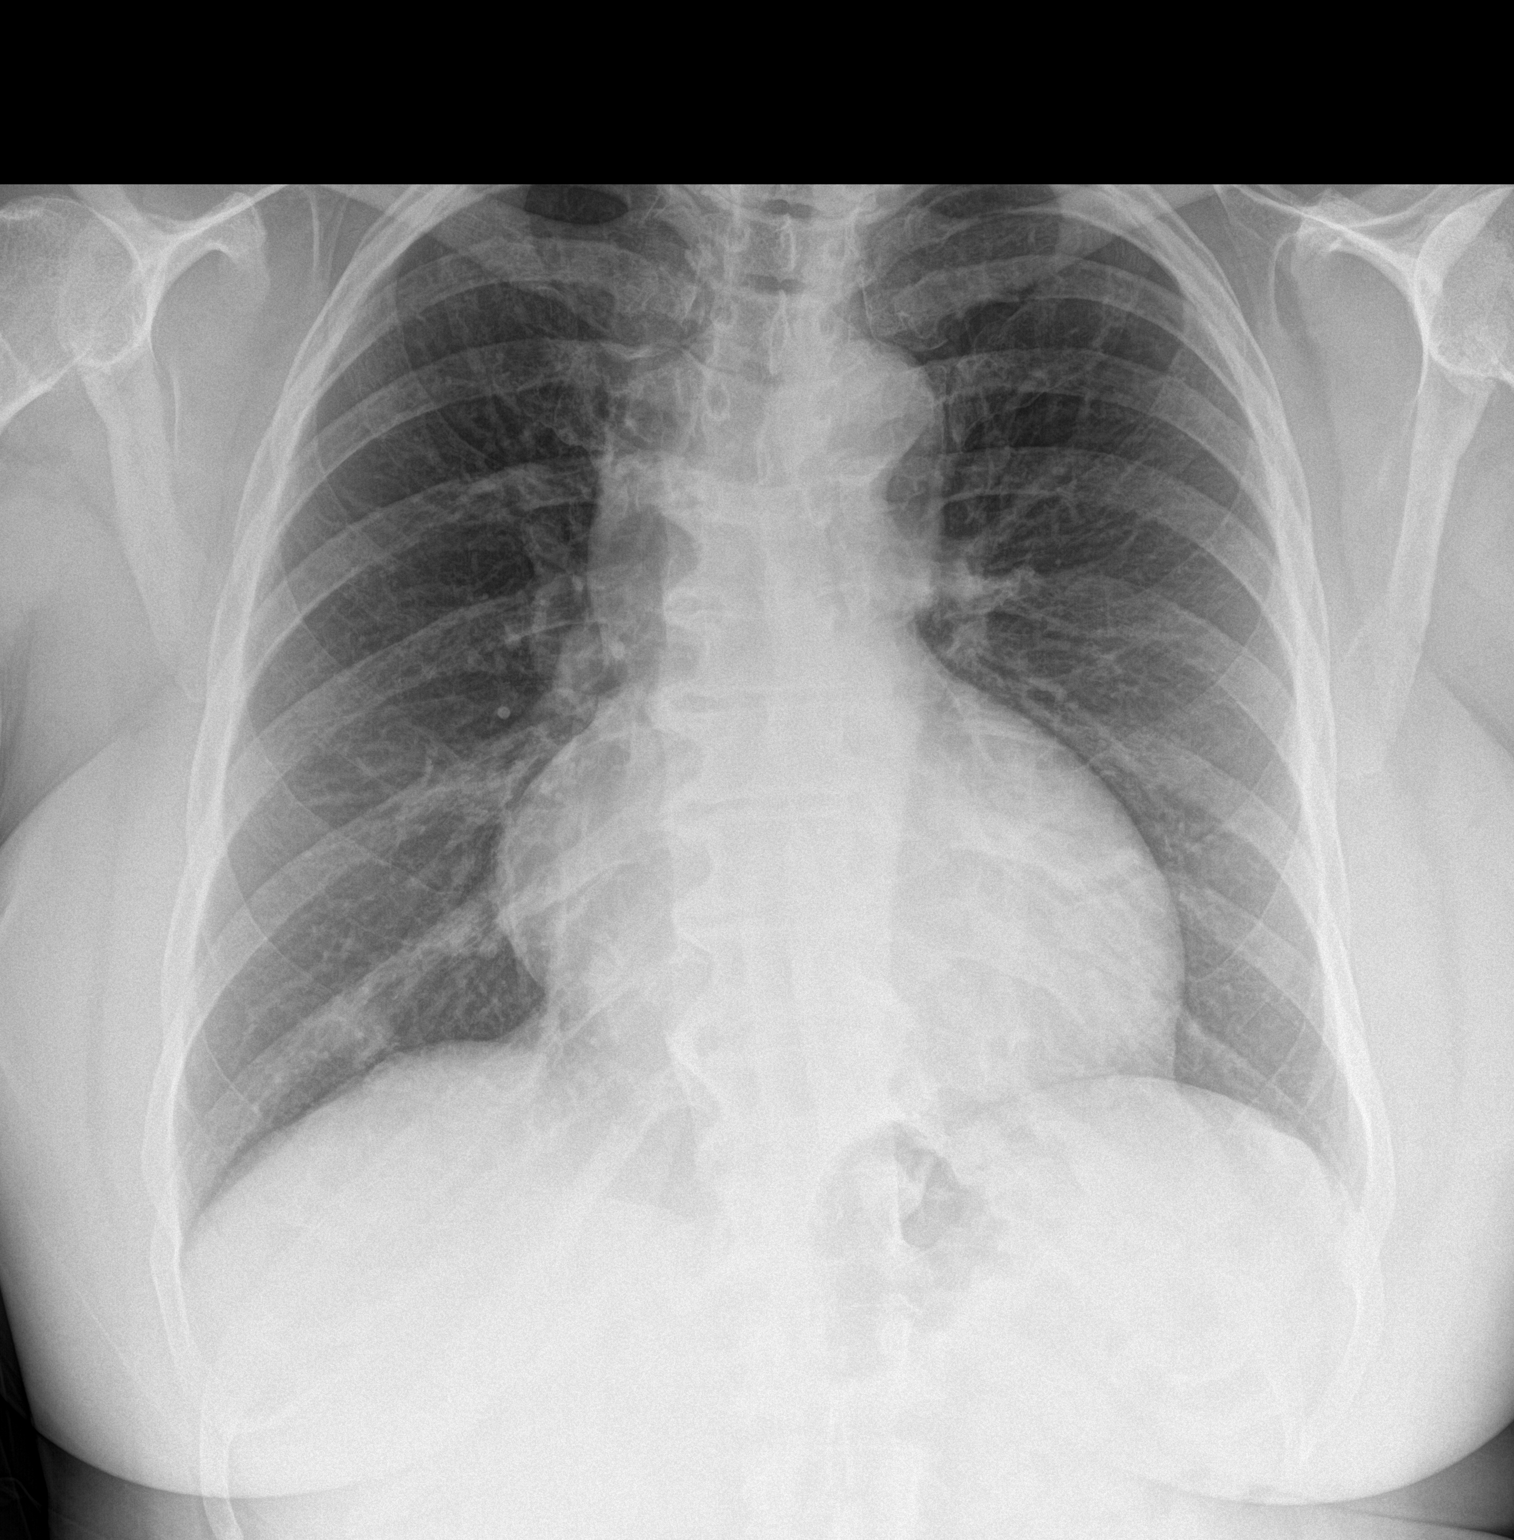
[im 2/2]
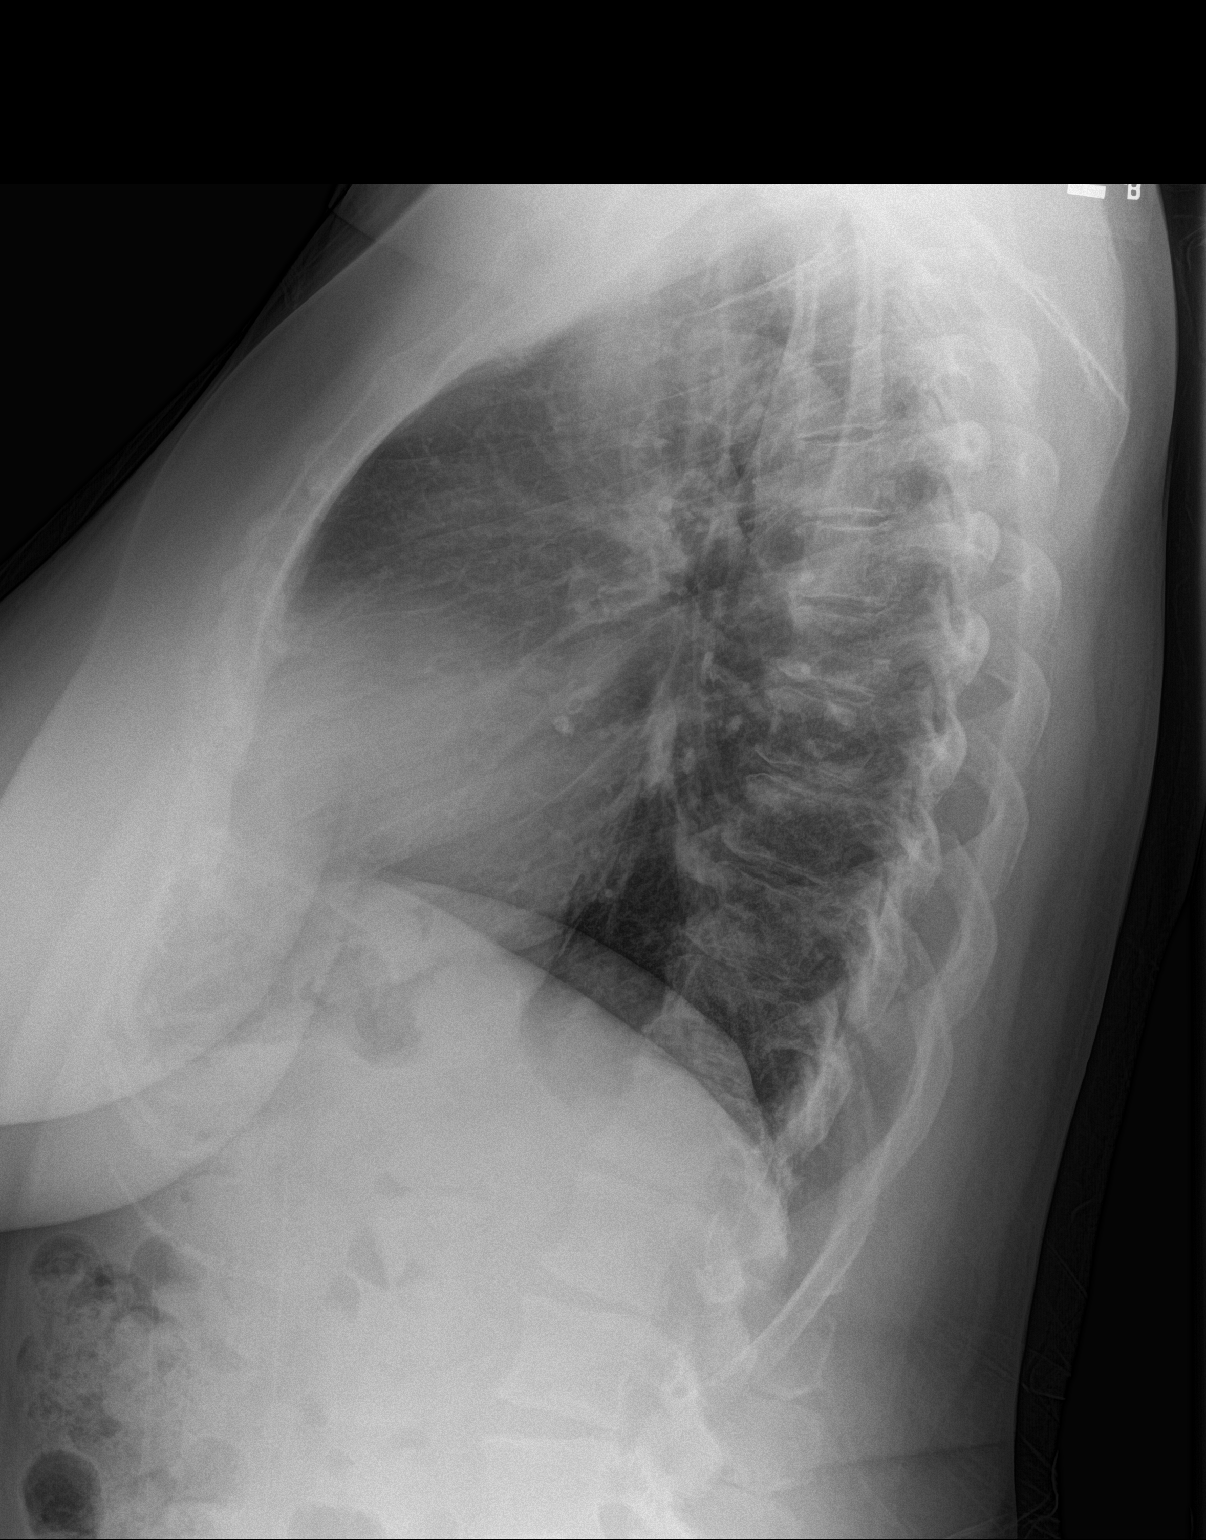

[2 of 2 positions shown; findings below may reference images not displayed]

FINDINGS: The right lung base nodule is visible on the frontal view only,
estimated at 15 mm diameter. Stable lung volumes. Stable mild
cardiomegaly. Other mediastinal contours are within normal limits.
Visualized tracheal air column is within normal limits. No
pneumothorax, pulmonary edema, pleural effusion or new pulmonary
opacity. No acute osseous abnormality identified.
IMPRESSION: Persistent right lower lobe nodule. Stable mild cardiomegaly. No
superimposed acute cardiopulmonary abnormality.

## 2017-11-19 IMAGING — DX DG CHEST 1V PORT
1 series · 1 of 1 positions shown · non-contrast
Comparison: Radiograph July 18, 2015.

CLINICAL DATA: Status post wedge resection of right lower lobe
mass.

EXAM:
PORTABLE CHEST 1 VIEW

[chest ap]
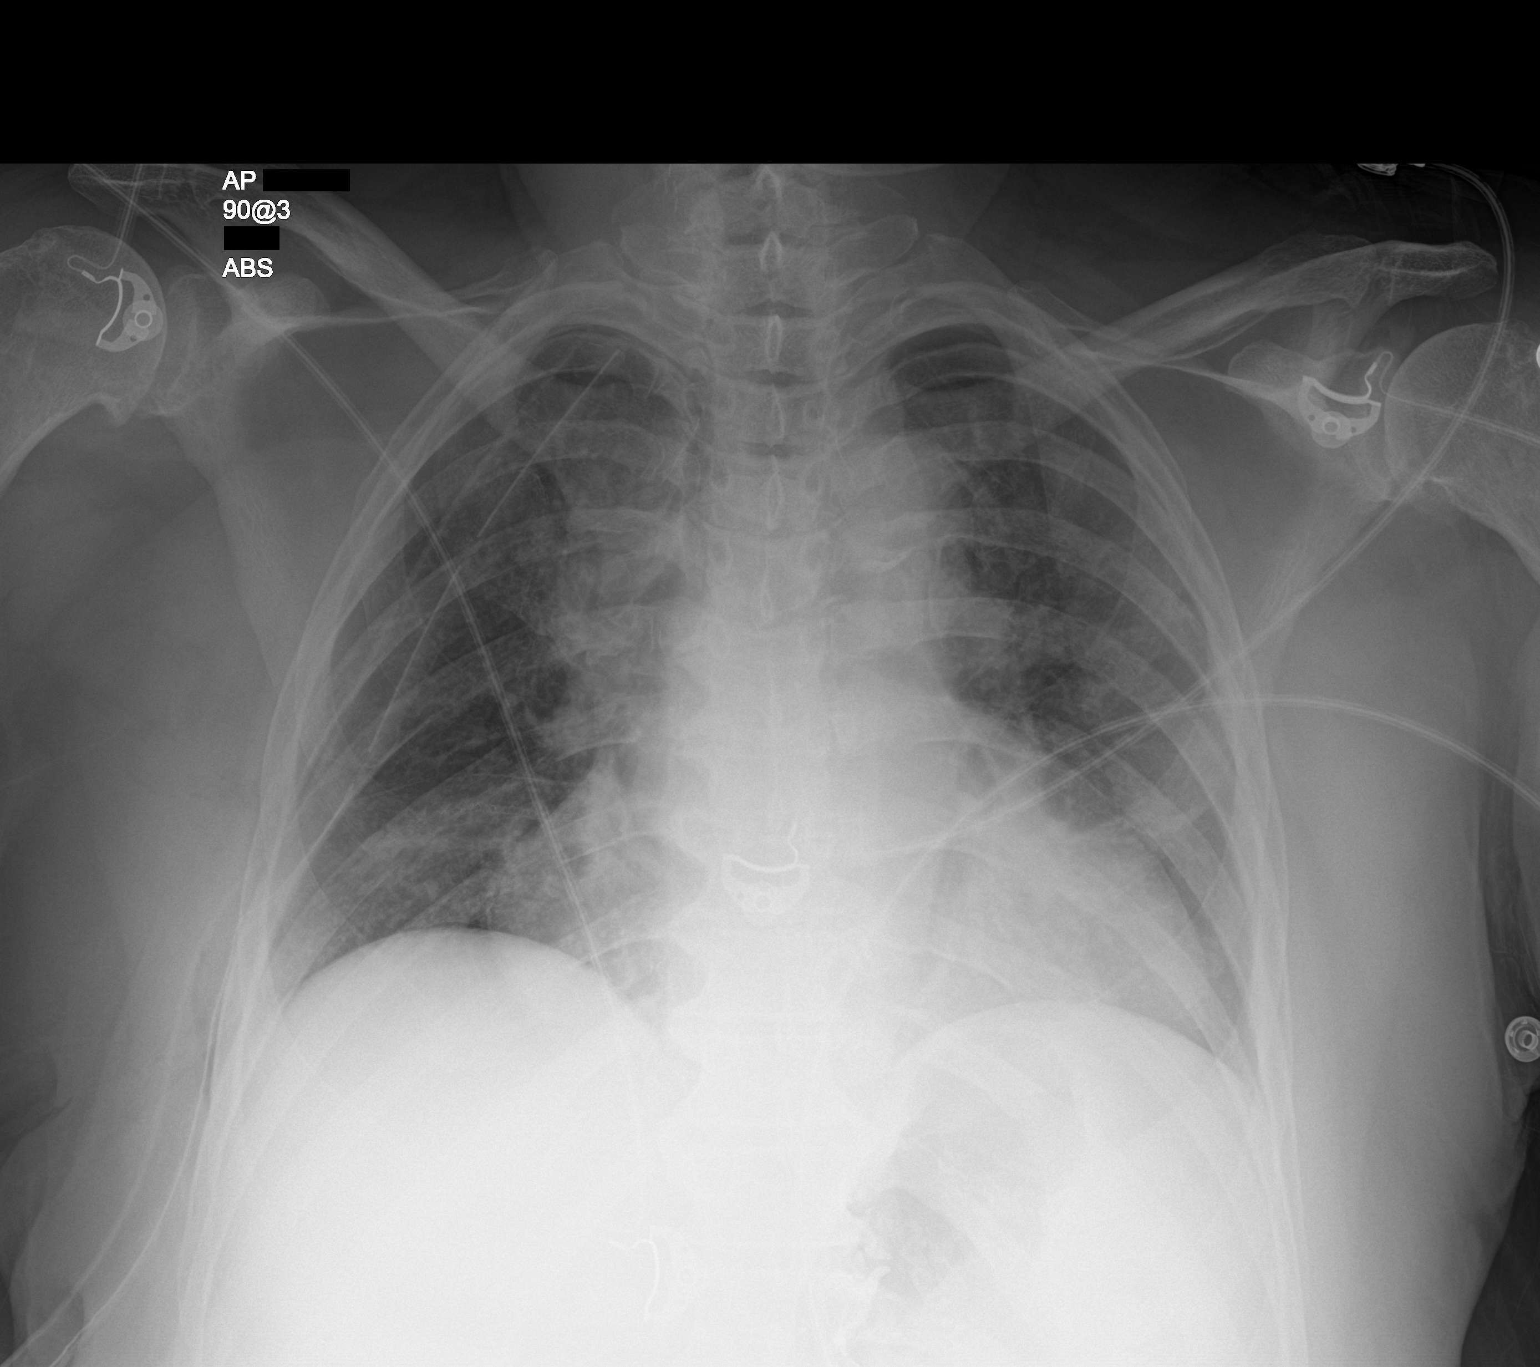

[1 of 1 positions shown; findings below may reference images not displayed]

FINDINGS: Stable cardiomegaly. Right-sided chest tube is in good position. No
definite pneumothorax is noted. Atherosclerosis thoracic aorta is
noted. Left lung is clear. Mild right basilar subsegmental
atelectasis is noted. Bony thorax is unremarkable.
IMPRESSION: Right-sided chest tube is in grossly good position with no
pneumothorax. Mild right basilar subsegmental atelectasis is noted.
Aortic atherosclerosis.

## 2017-12-04 IMAGING — CR DG CHEST 2V
1 series · 2 of 2 positions shown · non-contrast
Comparison: PA and lateral chest 07/28/2015.

CLINICAL DATA: Status post left resection of a right lower lobe
pulmonary nodule 07/25/2015. Postoperative imaging.

EXAM:
CHEST  2 VIEW

[Series 1: dg chest 2 view · 0.14mm/px · 2 of 2 slices shown]
[im 1/2]
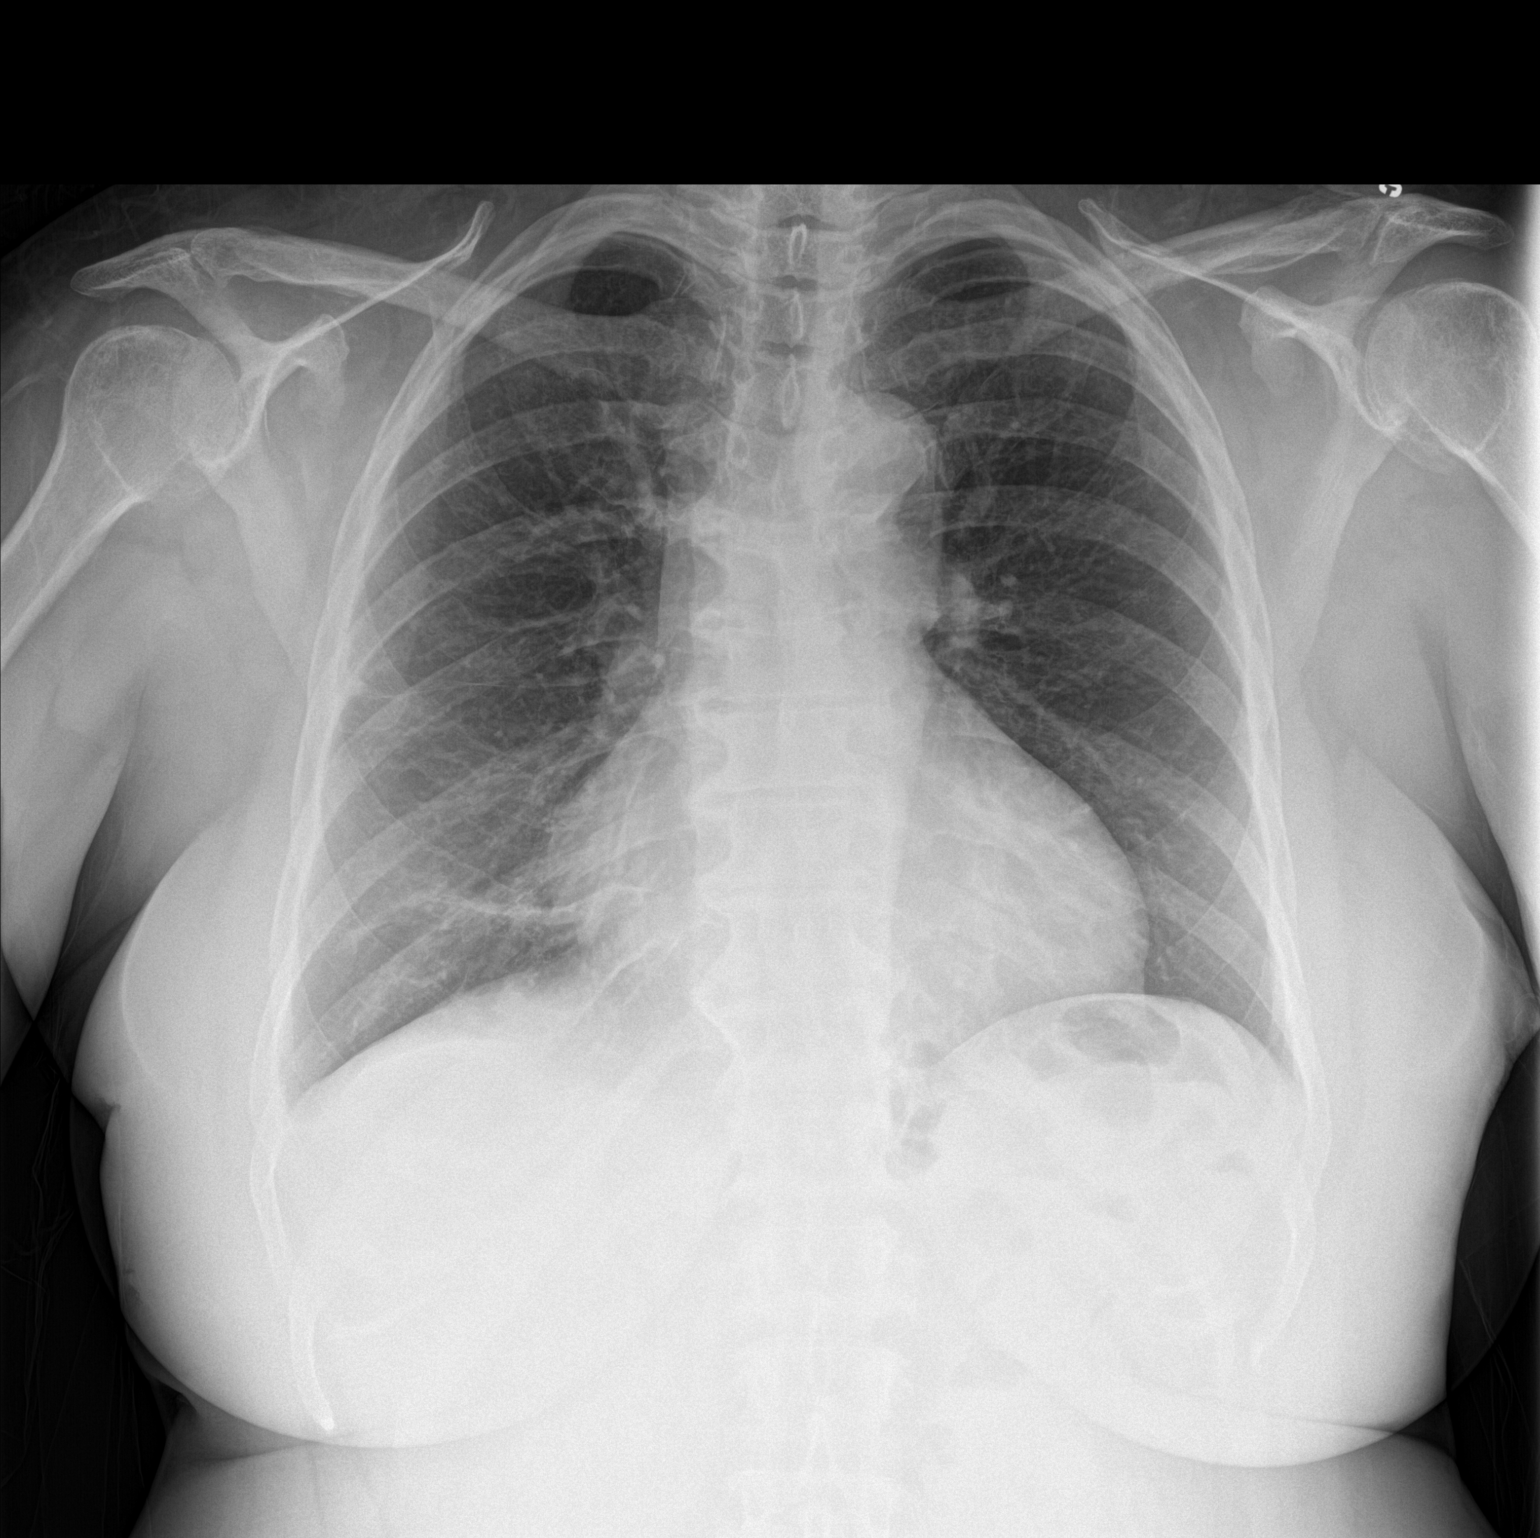
[im 2/2]
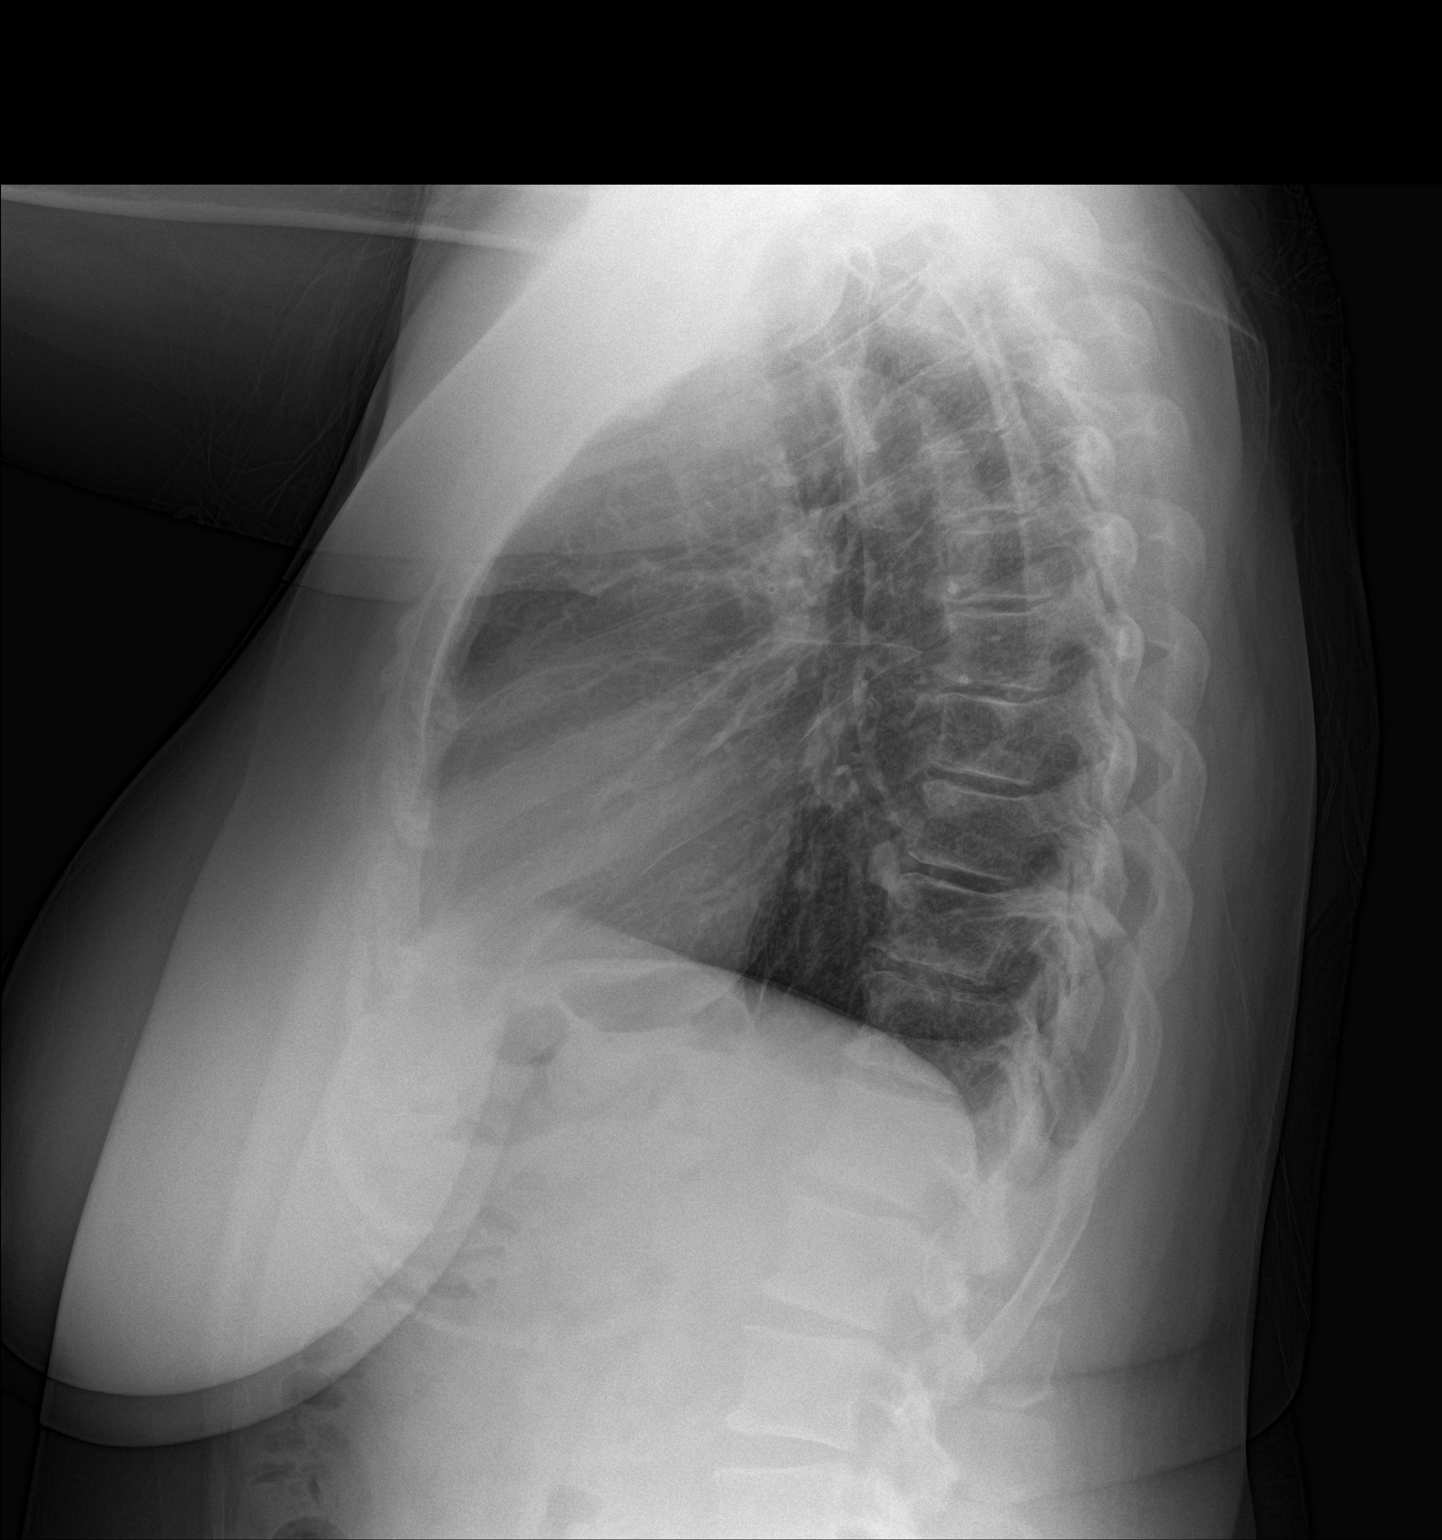

[2 of 2 positions shown; findings below may reference images not displayed]

FINDINGS: Linear opacity in the right lower lobe is compatible with
atelectasis or scar. The lungs otherwise appear clear. Heart size is
normal. No pneumothorax or pleural effusion. No focal bony
abnormality.
IMPRESSION: Postoperative change right lower lobe.  No acute disease.

## 2018-01-29 HISTORY — PX: CATARACT EXTRACTION W/ INTRAOCULAR LENS  IMPLANT, BILATERAL: SHX1307

## 2018-02-06 ENCOUNTER — Other Ambulatory Visit: Payer: Self-pay | Admitting: Internal Medicine

## 2018-02-06 DIAGNOSIS — Z1231 Encounter for screening mammogram for malignant neoplasm of breast: Secondary | ICD-10-CM

## 2018-02-12 ENCOUNTER — Ambulatory Visit
Admission: RE | Admit: 2018-02-12 | Discharge: 2018-02-12 | Disposition: A | Discharge status: Home / Self Care | Payer: BLUE CROSS/BLUE SHIELD | Source: Ambulatory Visit | Attending: Internal Medicine | Admitting: Internal Medicine

## 2018-02-12 DIAGNOSIS — Z1231 Encounter for screening mammogram for malignant neoplasm of breast: Secondary | ICD-10-CM | POA: Insufficient documentation

## 2019-02-23 ENCOUNTER — Other Ambulatory Visit: Payer: Self-pay | Admitting: Internal Medicine

## 2019-02-23 DIAGNOSIS — Z1231 Encounter for screening mammogram for malignant neoplasm of breast: Secondary | ICD-10-CM

## 2019-03-20 ENCOUNTER — Ambulatory Visit
Admission: RE | Admit: 2019-03-20 | Discharge: 2019-03-20 | Disposition: A | Discharge status: Home / Self Care | Payer: BC Managed Care – PPO | Source: Ambulatory Visit | Attending: Internal Medicine | Admitting: Internal Medicine

## 2019-03-20 DIAGNOSIS — Z1231 Encounter for screening mammogram for malignant neoplasm of breast: Secondary | ICD-10-CM | POA: Diagnosis present

## 2019-03-30 ENCOUNTER — Other Ambulatory Visit: Payer: Self-pay

## 2019-03-30 ENCOUNTER — Emergency Department: Payer: BC Managed Care – PPO

## 2019-03-30 ENCOUNTER — Encounter: Payer: Self-pay | Admitting: *Deleted

## 2019-03-30 ENCOUNTER — Other Ambulatory Visit: Payer: Self-pay | Admitting: Physician Assistant

## 2019-03-30 ENCOUNTER — Ambulatory Visit
Admission: RE | Admit: 2019-03-30 | Discharge: 2019-03-30 | Disposition: A | Discharge status: Home / Self Care | Payer: BC Managed Care – PPO | Source: Ambulatory Visit | Attending: Physician Assistant | Admitting: Physician Assistant

## 2019-03-30 ENCOUNTER — Emergency Department
Admission: EM | Admit: 2019-03-30 | Discharge: 2019-03-30 | Disposition: A | Discharge status: Home / Self Care | Payer: BC Managed Care – PPO | Attending: Emergency Medicine | Admitting: Emergency Medicine

## 2019-03-30 DIAGNOSIS — Z85850 Personal history of malignant neoplasm of thyroid: Secondary | ICD-10-CM | POA: Diagnosis not present

## 2019-03-30 DIAGNOSIS — Z79899 Other long term (current) drug therapy: Secondary | ICD-10-CM | POA: Diagnosis not present

## 2019-03-30 DIAGNOSIS — I10 Essential (primary) hypertension: Secondary | ICD-10-CM | POA: Diagnosis not present

## 2019-03-30 DIAGNOSIS — G510 Bell's palsy: Secondary | ICD-10-CM | POA: Diagnosis not present

## 2019-03-30 DIAGNOSIS — E039 Hypothyroidism, unspecified: Secondary | ICD-10-CM | POA: Insufficient documentation

## 2019-03-30 DIAGNOSIS — Q670 Congenital facial asymmetry: Secondary | ICD-10-CM | POA: Insufficient documentation

## 2019-03-30 DIAGNOSIS — R519 Headache, unspecified: Secondary | ICD-10-CM | POA: Insufficient documentation

## 2019-03-30 DIAGNOSIS — R29810 Facial weakness: Secondary | ICD-10-CM | POA: Diagnosis present

## 2019-03-30 LAB — BASIC METABOLIC PANEL
Anion gap: 10 (ref 5–15)
BUN: 14 mg/dL (ref 8–23)
CO2: 27 mmol/L (ref 22–32)
Calcium: 9.1 mg/dL (ref 8.9–10.3)
Chloride: 104 mmol/L (ref 98–111)
Creatinine, Ser: 0.98 mg/dL (ref 0.44–1.00)
GFR calc Af Amer: >60 mL/min (ref >60)
GFR calc non Af Amer: >60 mL/min (ref >60)
Glucose, Bld: 147 mg/dL — ABNORMAL HIGH (ref 70–99)
Potassium: 3.8 mmol/L (ref 3.5–5.1)
Sodium: 141 mmol/L (ref 135–145)

## 2019-03-30 LAB — CBC
HCT: 40.2 % (ref 36.0–46.0)
Hemoglobin: 13.3 g/dL (ref 12.0–15.0)
MCH: 28.8 pg (ref 26.0–34.0)
MCHC: 33.1 g/dL (ref 30.0–36.0)
MCV: 87 fL (ref 80.0–100.0)
Platelets: 303 10*3/uL (ref 150–400)
RBC: 4.62 MIL/uL (ref 3.87–5.11)
RDW: 13.5 % (ref 11.5–15.5)
WBC: 7.8 10*3/uL (ref 4.0–10.5)
nRBC: 0 % (ref 0.0–0.2)

## 2019-03-30 NOTE — ED Notes (Signed)
Pt transported to MRI 

## 2019-03-30 NOTE — Discharge Instructions (Addendum)
Follow-up with Dr. Manuella Ghazi tomorrow as scheduled.  Take the steroid and antiviral as prescribed.  Return to the ER for new or worsening headache, weakness, numbness, vision changes, or any other new or worsening symptoms that concern you.  You should use artificial tears type eyedrops in the left eye, and patch the eye with a piece of gauze and tape at night to keep it closed.  Otherwise, you can get a scratch to the cornea (clear part at the front of the eye) when you are sleeping.

## 2019-03-30 NOTE — ED Provider Notes (Signed)
[facility] Emergency Department Provider Note ____________________________________________   First MD Initiated Contact with Patient 03/30/19 1932     (approximate)  I have reviewed the triage vital signs and the nursing notes.   HISTORY  Chief Complaint Headache    HPI Brenda Mckinney is a 59 y.o. female with PMH as noted below who presents with left-sided facial droop, unknown exact time of onset but first noticed by her around 2 PM today.  The patient states that she initially noticed that the left side of her face felt strange and that when she tried to drink water it dribbled out of the left side of her mouth.  She reports a headache around the top of her head today, but denies any weakness or numbness throughout the rest of her body.  She has had no difficulty speaking or finding words, denies any vision changes.  She denies any prior episodes of similar facial droop or other neurologic symptoms.  The patient was seen in the outpatient clinic today and diagnosed with likely Bell's palsy.  She had a CT of her head which apparently showed an abnormality and she was told to come to the ER for further evaluation and an MRI.  Past Medical History:  Diagnosis Date  . Cancer ([place])    thyroid  . Hypertension   . Hypothyroidism   . Thyroid disease     Patient Active Problem List   Diagnosis Date Noted  . Hamartoma of lung ([place]) 07/25/2015  . Asthma without status asthmaticus 03/09/2014  . HLD (hyperlipidemia) 03/09/2014  . BP (high blood pressure) 03/09/2014  . Adult hypothyroidism 03/09/2014    Past Surgical History:  Procedure Laterality Date  . ABDOMINAL HYSTERECTOMY    . THYROIDECTOMY    . TUBAL LIGATION    . VIDEO ASSISTED THORACOSCOPY (VATS)/THOROCOTOMY Right 07/25/2015   Procedure: RIGHT THOROCOSCOPY REMOVAL RIGHT LOWER LOBE MASS, PREOP BRONCHOSCOPY;  Surgeon: Nestor Lewandowsky, MD;  Location: [facility] ORS;  Service: General;  Laterality:  Right;    Prior to Admission medications   Medication Sig Start Date End Date Taking? Authorizing Provider  Albuterol Sulfate 108 (90 Base) MCG/ACT AEPB Inhale 2 puffs into the lungs every 4 (four) hours as needed (shortness of breath or wheezing).  09/06/14   [provider]  amLODipine (NORVASC) 5 MG tablet Take 5 mg by mouth daily.  09/06/14 09/06/15  [provider]  fluticasone (FLONASE) 50 MCG/ACT nasal spray Place 2 sprays into both nostrils daily.    [provider]  Fluticasone-Salmeterol (ADVAIR) 250-50 MCG/DOSE AEPB Inhale 1 puff into the lungs 2 (two) times daily.    [provider]  levothyroxine (SYNTHROID, LEVOTHROID) 112 MCG tablet Take 112 mcg by mouth daily before breakfast.  03/28/15   [provider]  lisinopril-hydrochlorothiazide (PRINZIDE,ZESTORETIC) 20-25 MG tablet Take 1 tablet by mouth daily.  12/06/14   [provider]  metoprolol tartrate (LOPRESSOR) 25 MG tablet Take 25 mg by mouth 2 (two) times daily.  11/12/14   [provider]  oxyCODONE-acetaminophen (PERCOCET/ROXICET) 5-325 MG tablet Take 1 tablet by mouth every 4 (four) hours as needed for severe pain.    [provider]  simvastatin (ZOCOR) 40 MG tablet Take 40 mg by mouth daily.  11/12/14   [provider]  traMADol (ULTRAM) 50 MG tablet Take 50 mg by mouth every 6 (six) hours as needed.    [provider]    Allergies Patient has no known allergies.  Family  History  Problem Relation Age of Onset  . Breast cancer Paternal Aunt   . Lymphoma Sister     Social History Social History   Tobacco Use  . Smoking status: Never Smoker  . Smokeless tobacco: Never Used  Substance Use Topics  . Alcohol use: No  . Drug use: No    Review of Systems  Constitutional: No fever. Eyes: No visual changes. ENT: No neck pain.. Cardiovascular: Denies chest pain. Respiratory: Denies shortness of breath. Gastrointestinal: No nausea  or vomiting. Genitourinary: Negative for dysuria.  Musculoskeletal: Negative for back pain. Skin: Negative for rash. Neurological: Positive for headache and left facial droop.  Negative for weakness or numbness.   ____________________________________________   PHYSICAL EXAM:  VITAL SIGNS: ED Triage Vitals  Enc Vitals Group     BP 03/30/19 1915 (!) 170/66     Pulse Rate 03/30/19 1915 (!) 59     Resp 03/30/19 1915 18     Temp 03/30/19 1915 98.4 F (36.9 C)     Temp Source 03/30/19 1915 Oral     SpO2 03/30/19 1915 95 %     Weight 03/30/19 1916 212 lb (96.2 kg)     Height 03/30/19 1916 5\' 5"  (1.651 m)     Head Circumference --      Peak Flow --      Pain Score 03/30/19 1916 5     Pain Loc --      Pain Edu? --      Excl. in [place]? --     Constitutional: Alert and oriented. Well appearing and in no acute distress. Eyes: Conjunctivae are normal.  EOMI.  PERRLA. Head: Atraumatic. Nose: No congestion/rhinnorhea. Mouth/Throat: Mucous membranes are moist.   Neck: Normal range of motion.  Cardiovascular: Normal rate, regular rhythm.  Good peripheral circulation. Respiratory: Normal respiratory effort.  No retractions. Gastrointestinal: No distention.  Musculoskeletal: No lower extremity edema.  Extremities warm and well perfused.  Neurologic:  Normal speech and language.  Left facial droop with forehead involvement.  Decreased/altered sensation to the left face including the forehead.  5/5 motor strength and intact sensation to all 4 extremities.  No pronator drift.  No ataxia. Skin:  Skin is warm and dry. No rash noted. Psychiatric: Mood and affect are normal. Speech and behavior are normal.  ____________________________________________   LABS (all labs ordered are listed, but only abnormal results are displayed)  Labs Reviewed  BASIC METABOLIC PANEL - Abnormal; Notable for the following components:      Result Value   Glucose, Bld 147 (*)    All other components within  normal limits  CBC   ____________________________________________  EKG   ____________________________________________  RADIOLOGY  MR brain: No acute hemorrhage or infarct.  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  ____________________________________________   PROCEDURES  Procedure(s) performed: No  Procedures  Critical Care performed: No ____________________________________________   INITIAL IMPRESSION / ASSESSMENT AND PLAN / ED COURSE  Pertinent labs & imaging results that were available during my care of the patient were reviewed by me and considered in my medical decision making (see chart for details).  59 year old female with PMH as noted above presents with left facial droop noticed earlier today with no other acute neurologic symptoms.  The patient was seen in the outpatient clinic, had a CT that she was subsequently called and told was abnormal, and instructed to come to the ED for an MRI.  I reviewed the past medical records in [place].  I am  able to see the note from the clinic visit today with a suspected diagnosis of Bell's palsy.  However, I do not have access to the CT result.  The patient states she was told there was something abnormal about the white matter in her frontal lobe.  On exam, the patient has left-sided facial droop and altered sensation throughout the left face, with forehead involvement.  The remainder of the neurologic exam is normal.  She is hypertensive with otherwise normal vital signs.  Overall presentation is most consistent with Bell's palsy.  I have ordered an MRI as per the recommendations that the patient was sent in with, and disposition will depend on the results of the study.  The patient reports that she has follow-up with neurology scheduled for 8:30 AM tomorrow morning.    ----------------------------------------- 9:21 PM on 03/30/2019 -----------------------------------------  I am able to see the CT read from earlier  today in PACS.  It is as follows:  1. Ill-defined hypodensity within the left greater than right frontal lobe white matter. Findings may reflect chronic small vessel ischemic disease. An acute white matter infarct cannot be excluded. 2. No demarcated cortical infarct or acute intracranial hemorrhage. 3. Mild generalized parenchymal atrophy. 4. Ethmoid sinus disease as described. Correlate for acute sinusitis.  ----------------------------------------- 9:43 PM on 03/30/2019 -----------------------------------------  MRI shows no evidence of acute infarct or hemorrhage, or any other acute abnormalities.  The findings seen on the CT in the left frontal lobe appears chronic.  At this time, the patient is stable for discharge home.  She has neurology follow-up tomorrow morning.  I instructed her to take the medications she was prescribed earlier today for the Bell's palsy.  I advised her to patch the left eye when sleeping, and to use artificial tears.  Return precautions given, and she expressed understanding. ____________________________________________   FINAL CLINICAL IMPRESSION(S) / ED DIAGNOSES  Final diagnoses:  Bell's palsy      NEW MEDICATIONS STARTED DURING THIS VISIT:  New Prescriptions   No medications on file     Note:  This document was prepared using Dragon voice recognition software and may include unintentional dictation errors.   Arta Silence, MD 03/30/19 2144

## 2019-03-30 NOTE — ED Triage Notes (Addendum)
Pt sent to er for MRI  Pt had head ct scan today for headache and facial asymetry. Speech slurred.  Pt had diff eating and drinking today.  Sx began this am, unknown time.  Pt alert.

## 2019-05-22 ENCOUNTER — Other Ambulatory Visit: Payer: Self-pay | Admitting: Orthopedic Surgery

## 2019-05-26 ENCOUNTER — Encounter
Admission: RE | Admit: 2019-05-26 | Discharge: 2019-05-26 | Disposition: A | Discharge status: Home / Self Care | Payer: BLUE CROSS/BLUE SHIELD | Source: Ambulatory Visit | Attending: Orthopedic Surgery | Admitting: Orthopedic Surgery

## 2019-05-26 ENCOUNTER — Other Ambulatory Visit: Payer: Self-pay

## 2019-05-26 HISTORY — DX: Hyperlipidemia, unspecified: E78.5

## 2019-05-26 HISTORY — DX: Cerebral infarction, unspecified: I63.9

## 2019-05-26 HISTORY — DX: Bell's palsy: G51.0

## 2019-05-26 HISTORY — DX: Unspecified osteoarthritis, unspecified site: M19.90

## 2019-05-26 HISTORY — DX: Unspecified asthma, uncomplicated: J45.909

## 2019-05-26 NOTE — Patient Instructions (Addendum)
COVID TESTING Date: Jun 02, 2019 TUESDAY Testing site:  [facility] [address] Thru Hours:  [id number] am - 1:00 pm Once you are tested, you are asked to stay quarantined (avoiding public places) until after your surgery.   Your procedure is scheduled on: Jun 04, 2019 THURSDAY Report to Day Surgery on the 2nd floor of the [facility]. To find out your arrival time, please call [phone] between 1PM - 3PM on: Jun 03, 2019 [facility]  REMEMBER: Instructions that are not followed completely may result in serious medical risk, up to and including death; or upon the discretion of your surgeon and anesthesiologist your surgery may need to be rescheduled.  Do not eat food after midnight the night before surgery.  No gum chewing, lozengers or hard candies.  You may however, drink CLEAR liquids up to 2 hours before you are scheduled to arrive for your surgery. Do not drink anything within 2 hours of your scheduled arrival time.  Clear liquids include: - water  - apple juice without pulp - gatorade (not RED) - black coffee or tea (Do NOT add milk or creamers to the coffee or tea) Do NOT drink anything that is not on this list.  Type 1 and Type 2 diabetics should only drink water.  ENSURE PRE-SURGERY CARBOHYDRATE DRINK:  Complete drinking 2 hours prior to scheduled arrival time.  TAKE THESE MEDICATIONS THE MORNING OF SURGERY WITH A SIP OF WATER: METOPROLOL LEVOTHYROXONE  Use inhalers on the day of surgery    CONTACT OFFICE REGARDING WHEN TO STOP ASPIRIN.  Stop Anti-inflammatories (NSAIDS) such as Advil, Aleve, Ibuprofen, Motrin, Naproxen, Naprosyn OR Aspirin based products such as Excedrin, Goodys Powder, BC Powder. (May take Tylenol or Acetaminophen if needed.)  Stop ANY OVER THE COUNTER supplements until after surgery. (May continue Vitamin D, Vitamin B, and multivitamin.)  No Alcohol for 24 hours before or after surgery.  No Smoking including  e-cigarettes for 24 hours prior to surgery.  No chewable tobacco products for at least 6 hours prior to surgery.  No nicotine patches on the day of surgery.  Do not use any "recreational" drugs for at least a week prior to your surgery.  Please be advised that the combination of cocaine and anesthesia may have negative outcomes, up to and including death. If you test positive for cocaine, your surgery will be cancelled.  On the morning of surgery brush your teeth with toothpaste and water, you may rinse your mouth with mouthwash if you wish. Do not swallow any toothpaste or mouthwash.  Do not wear jewelry, make-up, hairpins, clips or nail polish.  Do not wear lotions, powders, or perfumes OR DEODORANT   Do not shave 48 hours prior to surgery.   Contact lenses, hearing aids and dentures may not be worn into surgery.  Do not bring valuables to the hospital. [facility] is not responsible for any missing/lost belongings or valuables.   Use CHG Soap  wipes as directed on instruction sheet.  Notify your doctor if there is any change in your medical condition (cold, fever, infection).  Wear comfortable clothing (specific to your surgery type) to the hospital.  Plan for stool softeners for home use; pain medications have a tendency to cause constipation. You can also help prevent constipation by eating foods high in fiber such as fruits and vegetables and drinking plenty of fluids as your diet allows.  After surgery, you can help prevent lung complications by doing breathing exercises.  Take deep breaths and cough every 1-2 hours. Your doctor may order a device called an Incentive Spirometer to help you take deep breaths. When coughing or sneezing, hold a pillow firmly against your incision with both hands. This is called "splinting." Doing this helps protect your incision. It also decreases belly discomfort.  If you are being admitted to the hospital overnight, [place].   If you are being discharged the day of surgery, you will not be allowed to drive home. You will need a responsible adult (18 years or older) to drive you home and stay with you that night.   If you are taking public transportation, you will need to have a responsible adult (18 years or older) with you. Please confirm with your physician that it is acceptable to use public transportation.   Please call the [facility] at [phone] if you have any questions about these instructions.  Visitation Policy:  Patients undergoing a surgery or procedure may have one family member or support person with them as long as that person is not COVID-19 positive or experiencing its symptoms.  That person may remain in the waiting area during the procedure.  Children under 44 years of age may have both parents or legal guardians with them during their hospital stay.   Inpatient Visitation Update:  Two designated support people may visit a patient during visiting hours 7 am to 8 pm. It must be the same two designated people for the duration of the patient stay. The visitors may come and go during the day, and there is no switching out to have different visitors. A mask must be worn at all times, including in the patient room.

## 2019-05-27 ENCOUNTER — Encounter
Admission: RE | Admit: 2019-05-27 | Discharge: 2019-05-27 | Disposition: A | Discharge status: Home / Self Care | Payer: BC Managed Care – PPO | Source: Ambulatory Visit | Attending: Orthopedic Surgery | Admitting: Orthopedic Surgery

## 2019-05-27 DIAGNOSIS — Z01818 Encounter for other preprocedural examination: Secondary | ICD-10-CM | POA: Diagnosis present

## 2019-05-27 LAB — CBC WITH DIFFERENTIAL/PLATELET
Abs Immature Granulocytes: 0.02 10*3/uL (ref 0.00–0.07)
Basophils Absolute: 0.1 10*3/uL (ref 0.0–0.1)
Basophils Relative: 1 %
Eosinophils Absolute: 0 10*3/uL (ref 0.0–0.5)
Eosinophils Relative: 0 %
HCT: 39.1 % (ref 36.0–46.0)
Hemoglobin: 13.1 g/dL (ref 12.0–15.0)
Immature Granulocytes: 0 %
Lymphocytes Relative: 21 %
Lymphs Abs: 1.4 10*3/uL (ref 0.7–4.0)
MCH: 30 pg (ref 26.0–34.0)
MCHC: 33.5 g/dL (ref 30.0–36.0)
MCV: 89.7 fL (ref 80.0–100.0)
Monocytes Absolute: 0.4 10*3/uL (ref 0.1–1.0)
Monocytes Relative: 6 %
Neutro Abs: 5 10*3/uL (ref 1.7–7.7)
Neutrophils Relative %: 72 %
Platelets: 332 10*3/uL (ref 150–400)
RBC: 4.36 MIL/uL (ref 3.87–5.11)
RDW: 14.6 % (ref 11.5–15.5)
WBC: 6.9 10*3/uL (ref 4.0–10.5)
nRBC: 0 % (ref 0.0–0.2)

## 2019-05-27 LAB — COMPREHENSIVE METABOLIC PANEL
ALT: 17 U/L (ref 0–44)
AST: 16 U/L (ref 15–41)
Albumin: 4.4 g/dL (ref 3.5–5.0)
Alkaline Phosphatase: 65 U/L (ref 38–126)
Anion gap: 9 (ref 5–15)
BUN: 15 mg/dL (ref 8–23)
CO2: 28 mmol/L (ref 22–32)
Calcium: 9.1 mg/dL (ref 8.9–10.3)
Chloride: 103 mmol/L (ref 98–111)
Creatinine, Ser: 0.79 mg/dL (ref 0.44–1.00)
GFR calc Af Amer: >60 mL/min (ref >60)
GFR calc non Af Amer: >60 mL/min (ref >60)
Glucose, Bld: 87 mg/dL (ref 70–99)
Potassium: 3.7 mmol/L (ref 3.5–5.1)
Sodium: 140 mmol/L (ref 135–145)
Total Bilirubin: 0.7 mg/dL (ref 0.3–1.2)
Total Protein: 7.5 g/dL (ref 6.5–8.1)

## 2019-05-27 LAB — TYPE AND SCREEN
ABO/RH(D): O NEG
Antibody Screen: NEGATIVE

## 2019-05-27 LAB — SURGICAL PCR SCREEN
MRSA, PCR: NEGATIVE
Staphylococcus aureus: NEGATIVE

## 2019-05-27 LAB — URINALYSIS, ROUTINE W REFLEX MICROSCOPIC
Bilirubin Urine: NEGATIVE
Glucose, UA: NEGATIVE mg/dL
Hgb urine dipstick: NEGATIVE
Ketones, ur: NEGATIVE mg/dL
Leukocytes,Ua: NEGATIVE
Nitrite: NEGATIVE
Protein, ur: NEGATIVE mg/dL
Specific Gravity, Urine: 1.004 — ABNORMAL LOW (ref 1.005–1.030)
pH: 6 (ref 5.0–8.0)

## 2019-05-27 NOTE — Pre-Procedure Instructions (Signed)
Pre-Admit Testing Provider Notification Note  Provider Notified: Dr. Edwina Barth &/or Boykin Reaper (PCP)  Notification Mode: Fax  Reason: Request for Medical Clearance  Response: Fax confirmation received.  Additional Information: Placed on chart. Noted on PAT worksheet.  Signed: Beulah Gandy, RN

## 2019-05-27 NOTE — Pre-Procedure Instructions (Signed)
Pre-Admit Testing Provider Notification Note  Provider Notified: Dr. Manuella Ghazi  Notification Mode: Fax  Reason: Request for Neurology Clearance  Response: Fax confirmation received.  Additional Information: Placed on chart. Noted on PAT worksheet.  Signed: Beulah Gandy, RN

## 2019-06-01 NOTE — Pre-Procedure Instructions (Addendum)
REGARDING MEDICAL CLEARANCE   UPDATE: 06/01/19 @ 1230 Medical Clearance Received.   I called Dr. Cheri Fowler McLaughlin's office to check on the status of medical clearance request that was faxed on 05/27/19 with fax confirmation received. The receptionist did not see any notes concerning the receipt of this request. I confirmed that the fax number it was sent to was correct: [phone]. She also gave me an alternative fax number to re-fax the request to: [phone].  Pre-Admit Testing Provider Notification Note  Provider Notified: Dr. Edwina Barth OR Boykin Reaper  Notification Mode: Fax to both [phone] AND [phone].  Reason: Urgent/Time Sensitive Medical Clearance Request  Response: Fax confirmation received.  Additional Information: Placed on chart.   Signed: Beulah Gandy, RN

## 2019-06-02 ENCOUNTER — Other Ambulatory Visit: Admission: RE | Admit: 2019-06-02 | Payer: BLUE CROSS/BLUE SHIELD | Source: Ambulatory Visit

## 2019-06-03 ENCOUNTER — Other Ambulatory Visit
Admission: RE | Admit: 2019-06-03 | Discharge: 2019-06-03 | Disposition: A | Discharge status: Home / Self Care | Payer: BLUE CROSS/BLUE SHIELD | Source: Ambulatory Visit | Attending: Orthopedic Surgery | Admitting: Orthopedic Surgery

## 2019-06-03 ENCOUNTER — Other Ambulatory Visit: Payer: Self-pay

## 2019-06-03 LAB — SARS CORONAVIRUS 2 (TAT 6-24 HRS): SARS Coronavirus 2: NEGATIVE

## 2019-06-04 ENCOUNTER — Inpatient Hospital Stay: Payer: BC Managed Care – PPO

## 2019-06-04 ENCOUNTER — Encounter
Admission: RE | Disposition: A | Discharge status: Home / Self Care | Payer: Self-pay | Source: Home / Self Care | Attending: Orthopedic Surgery

## 2019-06-04 ENCOUNTER — Other Ambulatory Visit: Payer: Self-pay

## 2019-06-04 ENCOUNTER — Encounter: Payer: Self-pay | Admitting: Orthopedic Surgery

## 2019-06-04 ENCOUNTER — Inpatient Hospital Stay: Payer: BC Managed Care – PPO | Admitting: Anesthesiology

## 2019-06-04 ENCOUNTER — Inpatient Hospital Stay
Admission: RE | Admit: 2019-06-04 | Discharge: 2019-06-07 | DRG: 470 | Disposition: A | Discharge status: Home / Self Care | Payer: BC Managed Care – PPO | Attending: Orthopedic Surgery | Admitting: Orthopedic Surgery

## 2019-06-04 DIAGNOSIS — I10 Essential (primary) hypertension: Secondary | ICD-10-CM | POA: Diagnosis present

## 2019-06-04 DIAGNOSIS — G8918 Other acute postprocedural pain: Secondary | ICD-10-CM

## 2019-06-04 DIAGNOSIS — G510 Bell's palsy: Secondary | ICD-10-CM | POA: Diagnosis present

## 2019-06-04 DIAGNOSIS — M1611 Unilateral primary osteoarthritis, right hip: Secondary | ICD-10-CM | POA: Diagnosis present

## 2019-06-04 DIAGNOSIS — Z419 Encounter for procedure for purposes other than remedying health state, unspecified: Secondary | ICD-10-CM

## 2019-06-04 DIAGNOSIS — Z832 Family history of diseases of the blood and blood-forming organs and certain disorders involving the immune mechanism: Secondary | ICD-10-CM | POA: Diagnosis not present

## 2019-06-04 DIAGNOSIS — E785 Hyperlipidemia, unspecified: Secondary | ICD-10-CM | POA: Diagnosis present

## 2019-06-04 DIAGNOSIS — D62 Acute posthemorrhagic anemia: Secondary | ICD-10-CM | POA: Diagnosis present

## 2019-06-04 DIAGNOSIS — J45909 Unspecified asthma, uncomplicated: Secondary | ICD-10-CM | POA: Diagnosis present

## 2019-06-04 DIAGNOSIS — Z8249 Family history of ischemic heart disease and other diseases of the circulatory system: Secondary | ICD-10-CM

## 2019-06-04 DIAGNOSIS — Z85850 Personal history of malignant neoplasm of thyroid: Secondary | ICD-10-CM | POA: Diagnosis not present

## 2019-06-04 DIAGNOSIS — Z96641 Presence of right artificial hip joint: Secondary | ICD-10-CM

## 2019-06-04 DIAGNOSIS — Z79899 Other long term (current) drug therapy: Secondary | ICD-10-CM

## 2019-06-04 DIAGNOSIS — Z7952 Long term (current) use of systemic steroids: Secondary | ICD-10-CM | POA: Diagnosis not present

## 2019-06-04 DIAGNOSIS — Z79891 Long term (current) use of opiate analgesic: Secondary | ICD-10-CM | POA: Diagnosis not present

## 2019-06-04 DIAGNOSIS — Z79890 Hormone replacement therapy (postmenopausal): Secondary | ICD-10-CM

## 2019-06-04 DIAGNOSIS — Z8673 Personal history of transient ischemic attack (TIA), and cerebral infarction without residual deficits: Secondary | ICD-10-CM

## 2019-06-04 DIAGNOSIS — Z823 Family history of stroke: Secondary | ICD-10-CM

## 2019-06-04 DIAGNOSIS — Z90710 Acquired absence of both cervix and uterus: Secondary | ICD-10-CM | POA: Diagnosis not present

## 2019-06-04 DIAGNOSIS — Z20822 Contact with and (suspected) exposure to covid-19: Secondary | ICD-10-CM | POA: Diagnosis present

## 2019-06-04 HISTORY — PX: TOTAL HIP ARTHROPLASTY: SHX124

## 2019-06-04 LAB — CBC
HCT: 33.5 % — ABNORMAL LOW (ref 36.0–46.0)
Hemoglobin: 11.1 g/dL — ABNORMAL LOW (ref 12.0–15.0)
MCH: 29.8 pg (ref 26.0–34.0)
MCHC: 33.1 g/dL (ref 30.0–36.0)
MCV: 89.8 fL (ref 80.0–100.0)
Platelets: 270 10*3/uL (ref 150–400)
RBC: 3.73 MIL/uL — ABNORMAL LOW (ref 3.87–5.11)
RDW: 14.6 % (ref 11.5–15.5)
WBC: 9.8 10*3/uL (ref 4.0–10.5)
nRBC: 0 % (ref 0.0–0.2)

## 2019-06-04 LAB — CREATININE, SERUM
Creatinine, Ser: 0.69 mg/dL (ref 0.44–1.00)
GFR calc Af Amer: >60 mL/min (ref >60)
GFR calc non Af Amer: >60 mL/min (ref >60)

## 2019-06-04 SURGERY — ARTHROPLASTY, HIP, TOTAL, ANTERIOR APPROACH
Anesthesia: Spinal | Site: Hip | Laterality: Right

## 2019-06-04 MED ORDER — ONDANSETRON HCL 4 MG/2ML IJ SOLN: 4.0000 mg | Freq: Once | INTRAMUSCULAR | Status: DC | PRN

## 2019-06-04 MED ORDER — BISACODYL 10 MG RE SUPP
10.0000 mg | Freq: Every day | RECTAL | Status: DC | PRN
  Administered 2019-06-07: 10 mg via RECTAL
  Filled 2019-06-04: qty 1

## 2019-06-04 MED ORDER — GLYCOPYRROLATE 0.2 MG/ML IJ SOLN
INTRAMUSCULAR | Status: AC
  Filled 2019-06-04: qty 1

## 2019-06-04 MED ORDER — ALUM & MAG HYDROXIDE-SIMETH 200-200-20 MG/5ML PO SUSP: 30.0000 mL | ORAL | Status: DC | PRN

## 2019-06-04 MED ORDER — FAMOTIDINE 20 MG PO TABS
ORAL_TABLET | ORAL | Status: AC
  Administered 2019-06-04: 09:00:00 20 mg via ORAL
  Filled 2019-06-04: qty 1

## 2019-06-04 MED ORDER — MIDAZOLAM HCL 2 MG/2ML IJ SOLN
INTRAMUSCULAR | Status: AC
  Filled 2019-06-04: qty 2

## 2019-06-04 MED ORDER — GLYCOPYRROLATE 0.2 MG/ML IJ SOLN
INTRAMUSCULAR | Status: DC | PRN
Start: 2019-06-04 — End: 2019-06-04
  Administered 2019-06-04 (×2): .2 mg via INTRAVENOUS

## 2019-06-04 MED ORDER — HYDROMORPHONE HCL 1 MG/ML IJ SOLN: 0.5000 mg | INTRAMUSCULAR | Status: DC | PRN

## 2019-06-04 MED ORDER — HYDROCHLOROTHIAZIDE 25 MG PO TABS
25.0000 mg | ORAL_TABLET | Freq: Every day | ORAL | Status: DC
  Administered 2019-06-04 – 2019-06-07 (×4): 25 mg via ORAL
  Filled 2019-06-04 (×4): qty 1

## 2019-06-04 MED ORDER — DIPHENHYDRAMINE HCL 12.5 MG/5ML PO ELIX: 12.5000 mg | ORAL_SOLUTION | ORAL | Status: DC | PRN

## 2019-06-04 MED ORDER — FLUTICASONE PROPIONATE 50 MCG/ACT NA SUSP
2.0000 | Freq: Every day | NASAL | Status: DC
  Administered 2019-06-05 – 2019-06-07 (×2): 2 via NASAL
  Filled 2019-06-04: qty 16

## 2019-06-04 MED ORDER — MAGNESIUM CITRATE PO SOLN
1.0000 | Freq: Once | ORAL | Status: DC | PRN
  Filled 2019-06-04: qty 296

## 2019-06-04 MED ORDER — ZOLPIDEM TARTRATE 5 MG PO TABS: 5.0000 mg | ORAL_TABLET | Freq: Every evening | ORAL | Status: DC | PRN

## 2019-06-04 MED ORDER — METHOCARBAMOL 1000 MG/10ML IJ SOLN
500.0000 mg | Freq: Four times a day (QID) | INTRAVENOUS | Status: DC | PRN
  Filled 2019-06-04: qty 5

## 2019-06-04 MED ORDER — PHENYLEPHRINE HCL (PRESSORS) 10 MG/ML IV SOLN
INTRAVENOUS | Status: DC | PRN
  Administered 2019-06-04: 100 ug via INTRAVENOUS
  Administered 2019-06-04: 200 ug via INTRAVENOUS
  Administered 2019-06-04: 100 ug via INTRAVENOUS

## 2019-06-04 MED ORDER — ATORVASTATIN CALCIUM 20 MG PO TABS
20.0000 mg | ORAL_TABLET | Freq: Every day | ORAL | Status: DC
  Administered 2019-06-05 – 2019-06-07 (×3): 20 mg via ORAL
  Filled 2019-06-04 (×3): qty 1

## 2019-06-04 MED ORDER — ONDANSETRON HCL 4 MG/2ML IJ SOLN
INTRAMUSCULAR | Status: DC | PRN
  Administered 2019-06-04: 4 mg via INTRAVENOUS

## 2019-06-04 MED ORDER — EPHEDRINE 5 MG/ML INJ
INTRAVENOUS | Status: AC
  Filled 2019-06-04: qty 10

## 2019-06-04 MED ORDER — SODIUM CHLORIDE (PF) 0.9 % IJ SOLN
INTRAMUSCULAR | Status: AC
  Filled 2019-06-04: qty 50

## 2019-06-04 MED ORDER — PHENYLEPHRINE HCL (PRESSORS) 10 MG/ML IV SOLN
INTRAVENOUS | Status: AC
  Filled 2019-06-04: qty 1

## 2019-06-04 MED ORDER — SODIUM CHLORIDE 0.9 % IV SOLN
INTRAVENOUS | Status: DC | PRN
  Administered 2019-06-04: 50 ug/min via INTRAVENOUS

## 2019-06-04 MED ORDER — PANTOPRAZOLE SODIUM 40 MG PO TBEC
40.0000 mg | DELAYED_RELEASE_TABLET | Freq: Every day | ORAL | Status: DC
  Administered 2019-06-04 – 2019-06-07 (×4): 40 mg via ORAL
  Filled 2019-06-04 (×4): qty 1

## 2019-06-04 MED ORDER — MIDAZOLAM HCL 5 MG/5ML IJ SOLN
INTRAMUSCULAR | Status: DC | PRN
  Administered 2019-06-04: 2 mg via INTRAVENOUS

## 2019-06-04 MED ORDER — EPHEDRINE SULFATE 50 MG/ML IJ SOLN
INTRAMUSCULAR | Status: DC | PRN
  Administered 2019-06-04: 10 mg via INTRAVENOUS

## 2019-06-04 MED ORDER — MOMETASONE FURO-FORMOTEROL FUM 200-5 MCG/ACT IN AERO
2.0000 | INHALATION_SPRAY | Freq: Two times a day (BID) | RESPIRATORY_TRACT | Status: DC
  Administered 2019-06-05 – 2019-06-07 (×4): 2 via RESPIRATORY_TRACT
  Filled 2019-06-04: qty 8.8

## 2019-06-04 MED ORDER — BUPIVACAINE-EPINEPHRINE 0.25% -1:200000 IJ SOLN
INTRAMUSCULAR | Status: DC | PRN
  Administered 2019-06-04: 30 mL

## 2019-06-04 MED ORDER — BUPIVACAINE HCL (PF) 0.25 % IJ SOLN
INTRAMUSCULAR | Status: AC
  Filled 2019-06-04: qty 30

## 2019-06-04 MED ORDER — ONDANSETRON HCL 4 MG PO TABS
4.0000 mg | ORAL_TABLET | Freq: Four times a day (QID) | ORAL | Status: DC | PRN
  Administered 2019-06-06: 4 mg via ORAL
  Filled 2019-06-04: qty 1

## 2019-06-04 MED ORDER — CEFAZOLIN SODIUM-DEXTROSE 2-4 GM/100ML-% IV SOLN
2.0000 g | Freq: Four times a day (QID) | INTRAVENOUS | Status: AC
  Administered 2019-06-04 – 2019-06-05 (×3): 2 g via INTRAVENOUS
  Filled 2019-06-04 (×3): qty 100

## 2019-06-04 MED ORDER — PROPOFOL 10 MG/ML IV BOLUS
INTRAVENOUS | Status: DC | PRN
  Administered 2019-06-04: 30 mg via INTRAVENOUS
  Administered 2019-06-04: 20 mg via INTRAVENOUS

## 2019-06-04 MED ORDER — METOCLOPRAMIDE HCL 5 MG/ML IJ SOLN: 5.0000 mg | Freq: Three times a day (TID) | INTRAMUSCULAR | Status: DC | PRN

## 2019-06-04 MED ORDER — ACETAMINOPHEN 10 MG/ML IV SOLN
INTRAVENOUS | Status: DC | PRN
  Administered 2019-06-04: 1000 mg via INTRAVENOUS

## 2019-06-04 MED ORDER — SODIUM CHLORIDE 0.9 % IV SOLN: INTRAVENOUS | Status: DC

## 2019-06-04 MED ORDER — METOPROLOL TARTRATE 25 MG PO TABS
25.0000 mg | ORAL_TABLET | Freq: Two times a day (BID) | ORAL | Status: DC
  Administered 2019-06-04 – 2019-06-07 (×6): 25 mg via ORAL
  Filled 2019-06-04 (×6): qty 1

## 2019-06-04 MED ORDER — ALBUTEROL SULFATE (2.5 MG/3ML) 0.083% IN NEBU: 2.5000 mg | INHALATION_SOLUTION | RESPIRATORY_TRACT | Status: DC | PRN

## 2019-06-04 MED ORDER — LISINOPRIL-HYDROCHLOROTHIAZIDE 20-25 MG PO TABS: 1.0000 | ORAL_TABLET | Freq: Every day | ORAL | Status: DC

## 2019-06-04 MED ORDER — METOCLOPRAMIDE HCL 10 MG PO TABS: 5.0000 mg | ORAL_TABLET | Freq: Three times a day (TID) | ORAL | Status: DC | PRN

## 2019-06-04 MED ORDER — FENTANYL CITRATE (PF) 100 MCG/2ML IJ SOLN: 25.0000 ug | INTRAMUSCULAR | Status: DC | PRN

## 2019-06-04 MED ORDER — ASPIRIN 81 MG PO CHEW
81.0000 mg | CHEWABLE_TABLET | Freq: Every day | ORAL | Status: DC
  Administered 2019-06-05 – 2019-06-07 (×3): 81 mg via ORAL
  Filled 2019-06-04 (×3): qty 1

## 2019-06-04 MED ORDER — PROPOFOL 500 MG/50ML IV EMUL
INTRAVENOUS | Status: DC | PRN
  Administered 2019-06-04: 50 ug/kg/min via INTRAVENOUS

## 2019-06-04 MED ORDER — CEFAZOLIN SODIUM-DEXTROSE 2-4 GM/100ML-% IV SOLN
2.0000 g | INTRAVENOUS | Status: AC
  Administered 2019-06-04: 2 g via INTRAVENOUS

## 2019-06-04 MED ORDER — MAGNESIUM HYDROXIDE 400 MG/5ML PO SUSP: 30.0000 mL | Freq: Every day | ORAL | Status: DC | PRN

## 2019-06-04 MED ORDER — METHOCARBAMOL 500 MG PO TABS
500.0000 mg | ORAL_TABLET | Freq: Four times a day (QID) | ORAL | Status: DC | PRN
  Administered 2019-06-04 – 2019-06-06 (×2): 500 mg via ORAL
  Filled 2019-06-04 (×2): qty 1

## 2019-06-04 MED ORDER — ACETAMINOPHEN 500 MG PO TABS
1000.0000 mg | ORAL_TABLET | Freq: Four times a day (QID) | ORAL | Status: DC
  Administered 2019-06-04 – 2019-06-05 (×3): 1000 mg via ORAL
  Filled 2019-06-04 (×3): qty 2

## 2019-06-04 MED ORDER — LEVOTHYROXINE SODIUM 112 MCG PO TABS
112.0000 ug | ORAL_TABLET | Freq: Every day | ORAL | Status: DC
  Administered 2019-06-05 – 2019-06-07 (×3): 112 ug via ORAL
  Filled 2019-06-04 (×5): qty 1

## 2019-06-04 MED ORDER — BUPIVACAINE HCL (PF) 0.5 % IJ SOLN
INTRAMUSCULAR | Status: DC | PRN
  Administered 2019-06-04: 3 mL

## 2019-06-04 MED ORDER — SODIUM CHLORIDE 0.9 % IV SOLN
INTRAVENOUS | Status: DC | PRN
  Administered 2019-06-04: 60 mL

## 2019-06-04 MED ORDER — OXYCODONE HCL 5 MG PO TABS
5.0000 mg | ORAL_TABLET | ORAL | Status: DC | PRN
  Administered 2019-06-04 – 2019-06-05 (×2): 5 mg via ORAL
  Administered 2019-06-06: 10 mg via ORAL
  Administered 2019-06-06: 5 mg via ORAL
  Administered 2019-06-07: 10 mg via ORAL
  Administered 2019-06-07: 5 mg via ORAL
  Filled 2019-06-04: qty 1
  Filled 2019-06-04: qty 2
  Filled 2019-06-04 (×2): qty 1
  Filled 2019-06-04 (×2): qty 2

## 2019-06-04 MED ORDER — BUPIVACAINE LIPOSOME 1.3 % IJ SUSP
INTRAMUSCULAR | Status: AC
  Filled 2019-06-04: qty 20

## 2019-06-04 MED ORDER — NEOMYCIN-POLYMYXIN B GU 40-200000 IR SOLN
Status: DC | PRN
  Administered 2019-06-04: 4 mL

## 2019-06-04 MED ORDER — PHENOL 1.4 % MT LIQD
1.0000 | OROMUCOSAL | Status: DC | PRN
  Filled 2019-06-04: qty 177

## 2019-06-04 MED ORDER — CHLORHEXIDINE GLUCONATE CLOTH 2 % EX PADS
6.0000 | MEDICATED_PAD | Freq: Every day | CUTANEOUS | Status: DC
  Administered 2019-06-05 – 2019-06-07 (×3): 6 via TOPICAL

## 2019-06-04 MED ORDER — CEFAZOLIN SODIUM-DEXTROSE 2-4 GM/100ML-% IV SOLN
INTRAVENOUS | Status: AC
  Filled 2019-06-04: qty 100

## 2019-06-04 MED ORDER — OXYCODONE HCL 5 MG PO TABS
10.0000 mg | ORAL_TABLET | ORAL | Status: DC | PRN
  Filled 2019-06-04: qty 2

## 2019-06-04 MED ORDER — SIMVASTATIN 40 MG PO TABS: 40.0000 mg | ORAL_TABLET | Freq: Every day | ORAL | Status: DC

## 2019-06-04 MED ORDER — GABAPENTIN 300 MG PO CAPS
300.0000 mg | ORAL_CAPSULE | Freq: Three times a day (TID) | ORAL | Status: DC
  Administered 2019-06-04 – 2019-06-07 (×9): 300 mg via ORAL
  Filled 2019-06-04 (×9): qty 1

## 2019-06-04 MED ORDER — LISINOPRIL 20 MG PO TABS
20.0000 mg | ORAL_TABLET | Freq: Every day | ORAL | Status: DC
  Administered 2019-06-04 – 2019-06-07 (×4): 20 mg via ORAL
  Filled 2019-06-04 (×4): qty 1

## 2019-06-04 MED ORDER — ONDANSETRON HCL 4 MG/2ML IJ SOLN
INTRAMUSCULAR | Status: AC
  Filled 2019-06-04: qty 2

## 2019-06-04 MED ORDER — TRAMADOL HCL 50 MG PO TABS
50.0000 mg | ORAL_TABLET | Freq: Four times a day (QID) | ORAL | Status: DC
  Administered 2019-06-04 – 2019-06-07 (×11): 50 mg via ORAL
  Filled 2019-06-04 (×11): qty 1

## 2019-06-04 MED ORDER — ENOXAPARIN SODIUM 40 MG/0.4ML ~~LOC~~ SOLN
40.0000 mg | SUBCUTANEOUS | Status: DC
  Administered 2019-06-05 – 2019-06-07 (×3): 40 mg via SUBCUTANEOUS
  Filled 2019-06-04 (×3): qty 0.4

## 2019-06-04 MED ORDER — ONDANSETRON HCL 4 MG/2ML IJ SOLN
4.0000 mg | Freq: Four times a day (QID) | INTRAMUSCULAR | Status: DC | PRN
  Administered 2019-06-05: 4 mg via INTRAVENOUS
  Filled 2019-06-04: qty 2

## 2019-06-04 MED ORDER — MENTHOL 3 MG MT LOZG
1.0000 | LOZENGE | OROMUCOSAL | Status: DC | PRN
  Filled 2019-06-04: qty 9

## 2019-06-04 MED ORDER — LACTATED RINGERS IV SOLN: INTRAVENOUS | Status: DC

## 2019-06-04 MED ORDER — DOCUSATE SODIUM 100 MG PO CAPS
100.0000 mg | ORAL_CAPSULE | Freq: Two times a day (BID) | ORAL | Status: DC
  Administered 2019-06-04 – 2019-06-07 (×6): 100 mg via ORAL
  Filled 2019-06-04 (×6): qty 1

## 2019-06-04 MED ORDER — ACETAMINOPHEN 325 MG PO TABS: 325.0000 mg | ORAL_TABLET | Freq: Four times a day (QID) | ORAL | Status: DC | PRN

## 2019-06-04 MED ORDER — EPINEPHRINE PF 1 MG/ML IJ SOLN
INTRAMUSCULAR | Status: AC
  Filled 2019-06-04: qty 1

## 2019-06-04 MED ORDER — ACETAMINOPHEN 10 MG/ML IV SOLN
INTRAVENOUS | Status: AC
  Filled 2019-06-04: qty 100

## 2019-06-04 MED ORDER — NEOMYCIN-POLYMYXIN B GU 40-200000 IR SOLN
Status: AC
  Filled 2019-06-04: qty 20

## 2019-06-04 MED ORDER — FAMOTIDINE 20 MG PO TABS: 20.0000 mg | ORAL_TABLET | Freq: Once | ORAL | Status: AC

## 2019-06-04 MED ORDER — AMLODIPINE BESYLATE 5 MG PO TABS
5.0000 mg | ORAL_TABLET | Freq: Every day | ORAL | Status: DC
  Administered 2019-06-04 – 2019-06-07 (×4): 5 mg via ORAL
  Filled 2019-06-04 (×4): qty 1

## 2019-06-04 MED ORDER — SODIUM CHLORIDE (PF) 0.9 % IJ SOLN
INTRAMUSCULAR | Status: DC | PRN
  Administered 2019-06-04: 40 mL

## 2019-06-04 SURGICAL SUPPLY — 56 items
BLADE SAGITTAL AGGR TOOTH XLG (BLADE) ×3 IMPLANT
BNDG COHESIVE 6X5 TAN STRL LF (GAUZE/BANDAGES/DRESSINGS) ×9 IMPLANT
CANISTER SUCT 1200ML W/VALVE (MISCELLANEOUS) ×3 IMPLANT
CANISTER WOUND CARE 500ML ATS (WOUND CARE) ×3 IMPLANT
CHLORAPREP W/TINT 26 (MISCELLANEOUS) ×3 IMPLANT
COVER BACK TABLE REUSABLE LG (DRAPES) ×3 IMPLANT
DRAPE 3/4 80X56 (DRAPES) ×9 IMPLANT
DRAPE C-ARM XRAY 36X54 (DRAPES) ×3 IMPLANT
DRAPE INCISE IOBAN 66X60 STRL (DRAPES) IMPLANT
DRAPE POUCH INSTRU U-SHP 10X18 (DRAPES) ×3 IMPLANT
DRESSING SURGICEL FIBRLLR 1X2 (HEMOSTASIS) ×2 IMPLANT
DRSG OPSITE POSTOP 4X8 (GAUZE/BANDAGES/DRESSINGS) ×6 IMPLANT
DRSG SURGICEL FIBRILLAR 1X2 (HEMOSTASIS) ×6
ELECT BLADE 6.5 EXT (BLADE) ×3 IMPLANT
ELECT REM PT RETURN 9FT ADLT (ELECTROSURGICAL) ×3
ELECTRODE REM PT RTRN 9FT ADLT (ELECTROSURGICAL) ×1 IMPLANT
GLOVE BIOGEL PI IND STRL 9 (GLOVE) ×1 IMPLANT
GLOVE BIOGEL PI INDICATOR 9 (GLOVE) ×2
GLOVE SURG SYN 9.0  PF PI (GLOVE) ×4
GLOVE SURG SYN 9.0 PF PI (GLOVE) ×2 IMPLANT
GOWN SRG 2XL LVL 4 RGLN SLV (GOWNS) ×1 IMPLANT
GOWN STRL NON-REIN 2XL LVL4 (GOWNS) ×2
GOWN STRL REUS W/ TWL LRG LVL3 (GOWN DISPOSABLE) ×1 IMPLANT
GOWN STRL REUS W/TWL LRG LVL3 (GOWN DISPOSABLE) ×2
HEAD FEMORAL 28MM SZ M (Head) ×3 IMPLANT
HOLDER FOLEY CATH W/STRAP (MISCELLANEOUS) ×3 IMPLANT
HOOD PEEL AWAY FLYTE STAYCOOL (MISCELLANEOUS) ×3 IMPLANT
KIT PREVENA INCISION MGT 13 (CANNISTER) ×3 IMPLANT
LINER DBL MOB SZ 0 52MM (Liner) ×3 IMPLANT
MAT ABSORB  FLUID 56X50 GRAY (MISCELLANEOUS) ×2
MAT ABSORB FLUID 56X50 GRAY (MISCELLANEOUS) ×1 IMPLANT
NDL SAFETY ECLIPSE 18X1.5 (NEEDLE) ×1 IMPLANT
NEEDLE HYPO 18GX1.5 SHARP (NEEDLE) ×2
NEEDLE SPNL 20GX3.5 QUINCKE YW (NEEDLE) ×6 IMPLANT
NS IRRIG 1000ML POUR BTL (IV SOLUTION) ×3 IMPLANT
PACK HIP COMPR (MISCELLANEOUS) ×3 IMPLANT
SCALPEL PROTECTED #10 DISP (BLADE) ×6 IMPLANT
SHELL ACETABULAR SZ 52 DM (Shell) ×3 IMPLANT
SLEEVE PROTECTION STRL DISP (MISCELLANEOUS) ×3 IMPLANT
SPONGE DRAIN TRACH 4X4 STRL 2S (GAUZE/BANDAGES/DRESSINGS) ×3 IMPLANT
STAPLER SKIN PROX 35W (STAPLE) ×3 IMPLANT
STEM FEMORAL 1 STD COLLARED (Stem) ×3 IMPLANT
STRAP SAFETY 5IN WIDE (MISCELLANEOUS) ×3 IMPLANT
SUT DVC 2 QUILL PDO  T11 36X36 (SUTURE) ×2
SUT DVC 2 QUILL PDO T11 36X36 (SUTURE) ×1 IMPLANT
SUT SILK 0 (SUTURE) ×2
SUT SILK 0 30XBRD TIE 6 (SUTURE) ×1 IMPLANT
SUT V-LOC 90 ABS DVC 3-0 CL (SUTURE) ×3 IMPLANT
SUT VIC AB 1 CT1 36 (SUTURE) ×3 IMPLANT
SYR 20ML LL LF (SYRINGE) ×3 IMPLANT
SYR 30ML LL (SYRINGE) ×3 IMPLANT
SYR 50ML LL SCALE MARK (SYRINGE) ×6 IMPLANT
SYR BULB IRRIG 60ML STRL (SYRINGE) ×3 IMPLANT
TAPE MICROFOAM 4IN (TAPE) ×3 IMPLANT
TOWEL OR 17X26 4PK STRL BLUE (TOWEL DISPOSABLE) ×3 IMPLANT
TRAY FOLEY MTR SLVR 16FR STAT (SET/KITS/TRAYS/PACK) ×3 IMPLANT

## 2019-06-04 NOTE — Anesthesia Preprocedure Evaluation (Addendum)
Anesthesia Evaluation  Patient identified by MRN, date of birth, ID band Patient awake    Reviewed: Allergy & Precautions, H&P , NPO status , Patient's Chart, lab work & pertinent test results, reviewed documented beta blocker date and time   Airway Mallampati: II   Neck ROM: full    Dental  (+) Poor Dentition   Pulmonary neg pulmonary ROS, asthma ,    Pulmonary exam normal        Cardiovascular hypertension, On Medications negative cardio ROS Normal cardiovascular exam Rhythm:regular Rate:Normal     Neuro/Psych  Neuromuscular disease CVA, Residual Symptoms negative psych ROS   GI/Hepatic negative GI ROS, Neg liver ROS,   Endo/Other  Hypothyroidism   Renal/GU negative Renal ROS  negative genitourinary   Musculoskeletal   Abdominal   Peds  Hematology negative hematology ROS (+)   Anesthesia Other Findings Past Medical History: No date: Arthritis No date: Asthma No date: Bell's palsy No date: Cancer ([employer])     Comment:  thyroid No date: Hyperlipemia No date: Hypertension No date: Hypothyroidism No date: Stroke [facility])     Comment:  DATE UNKNOWN No date: Thyroid disease Past Surgical History: No date: ABDOMINAL HYSTERECTOMY No date: COLONOSCOPY No date: EYE SURGERY; Bilateral No date: THYROIDECTOMY 06/04/2019: TOTAL HIP ARTHROPLASTY; Right     Comment:  Procedure: RIGHT TOTAL HIP ARTHROPLASTY ANTERIOR               APPROACH;  Surgeon: Hessie Knows, MD;  Location: [facility]               ORS;  Service: Orthopedics;  Laterality: Right; No date: TUBAL LIGATION 07/25/2015: VIDEO ASSISTED THORACOSCOPY (VATS)/THOROCOTOMY; Right     Comment:  Procedure: RIGHT THOROCOSCOPY REMOVAL RIGHT LOWER LOBE               MASS, PREOP BRONCHOSCOPY;  Surgeon: Nestor Lewandowsky, MD;                Location: [facility] ORS;  Service: General;  Laterality:               Right; BMI    Body Mass Index: 36.11 kg/m      Reproductive/Obstetrics negative OB ROS                            Anesthesia Physical Anesthesia Plan  ASA: III  Anesthesia Plan: General   Post-op Pain Management:    Induction:   PONV Risk Score and Plan:   Airway Management Planned:   Additional Equipment:   Intra-op Plan:   Post-operative Plan:   Informed Consent: I have reviewed the patients History and Physical, chart, labs and discussed the procedure including the risks, benefits and alternatives for the proposed anesthesia with the patient or authorized representative who has indicated his/her understanding and acceptance.     Dental Advisory Given  Plan Discussed with: CRNA  Anesthesia Plan Comments:         Anesthesia Quick Evaluation

## 2019-06-04 NOTE — H&P (Signed)
Chief Complaint  Patient presents with  . Hip Pain  H & P RIGHT HIP   History of Present Illness:   Brenda Mckinney is a 59 y.o. female that presents to clinic today for her preoperative history and evaluation. The patient is scheduled to undergo a right total hip arthroplasty through anterior approach on 06/04/19 by Dr. Rudene Christians. She reports a long history of right hip pain that has gotten worse over the last 3 months. The pain is located over the posterior and lateral aspects of the right hip with radiation down her thigh to her knee. She describes her pain as constant and throbbing.She denies associated numbness or tingling.   The patient's symptoms have progressed to the point that they decrease her quality of life. The patient has previously undergone conservative treatment including NSAIDS and activity modification without adequate control of her symptoms.  Denies history of blood clots, significant cardiac history, or history of lumbar surgery.   Past Medical, Surgical, Family, Social History, Allergies, Medications:   Past Medical History:  Past Medical History:  Diagnosis Date  . Allergic state  . Arthritis  . Asthma without status asthmaticus, unspecified  . Bell's palsy  . Cancer (CMS-HCC) Thyroid cancer  . Hyperlipidemia  . Hypertension  . Hypothyroidism   Past Surgical History:  Past Surgical History:  Procedure Laterality Date  . COLONOSCOPY ~ 2011  [place] ([place] - polyps per patient, records unavail  . HYSTERECTOMY 2001  Partial  . lung mass removal Left 06/2014  pt thinks it was her left  . THYROIDECTOMY TOTAL  s/p thyroid cancer  . TUBAL LIGATION 1987   Current Medications:  Current Outpatient Medications  Medication Sig Dispense Refill  . albuterol (PROVENTIL) 2.5 mg /3 mL (0.083 %) nebulizer solution TAKE 3MLS BY NEBULIZATION EVERY SIX HOURS AS NEEDED FOR WHEEZING 1080 mL 0  . albuterol 90 mcg/actuation inhaler INHALE 1 PUFF BY MOUTH EVERY  FOUR HOURS 8.5 g 5  . amLODIPine (NORVASC) 5 MG tablet TAKE 1 TABLET BY MOUTH DAILY 30 tablet 3  . aspirin 81 MG EC tablet Take 81 mg by mouth once daily  . fluticasone furoate-vilanteroL (BREO ELLIPTA) 100-25 mcg/dose DsDv inhaler Inhale 1 inhalation into the lungs once daily 1 Inhaler 4  . hydroCHLOROthiazide (HYDRODIURIL) 25 MG tablet Take 1 tablet (25 mg total) by mouth once daily 90 tablet 1  . levothyroxine (SYNTHROID) 112 MCG tablet TAKE 1 TABLET BY MOUTH DAILY 30 tablet 5  . lisinopriL (ZESTRIL) 40 MG tablet Take 1 tablet (40 mg total) by mouth once daily 90 tablet 1  . metoprolol tartrate (LOPRESSOR) 25 MG tablet TAKE 1 TABLET BY MOUTH TWICE A DAY 60 tablet 5  . montelukast (SINGULAIR) 10 mg tablet Take 1 tablet (10 mg total) by mouth nightly 90 tablet 1  . potassium chloride (KLOR-CON) 10 MEQ ER tablet Take 1 tablet (10 mEq total) by mouth once daily 30 tablet 11  . predniSONE (DELTASONE) 5 MG tablet TAKE 1 TABLET BY MOUTH DAILY 30 tablet 2  . simvastatin (ZOCOR) 40 MG tablet TAKE 1 TABLET BY MOUTH EVERY EVENING 30 tablet 5   No current facility-administered medications for this visit.   Allergies: No Known Allergies  Social History:  Social History   Socioeconomic History  . Marital status: Married  Spouse name: Not on file  . Number of children: Not on file  . Years of education: Not on file  . Highest education level: Not on file  Occupational  History  . Not on file  Tobacco Use  . Smoking status: Never Smoker  . Smokeless tobacco: Never Used  Vaping Use  . Vaping Use: Never used  Substance and Sexual Activity  . Alcohol use: Not Currently  Alcohol/week: 0.0 standard drinks  Comment: rarely  . Drug use: No  . Sexual activity: Yes  Partners: Male  Birth control/protection: Post-menopausal, Surgical  Other Topics Concern  . Not on file  Social History Narrative  . Not on file   Social Determinants of Health   Financial Resource Strain:  . Difficulty of  Paying Living Expenses:  Food Insecurity:  . Worried About Charity fundraiser in the Last Year:  . Arboriculturist in the Last Year:  Transportation Needs:  . Film/video editor (Medical):  Marland Kitchen Lack of Transportation (Non-Medical):  Physical Activity:  . Days of Exercise per Week:  . Minutes of Exercise per Session:  Stress:  . Feeling of Stress :  Social Connections:  . Frequency of Communication with Friends and Family:  . Frequency of Social Gatherings with Friends and Family:  . Attends Religious Services:  . Active Member of Clubs or Organizations:  . Attends Archivist Meetings:  Marland Kitchen Marital Status:   Family History:  Family History  Problem Relation Age of Onset  . Myocardial Infarction (Heart attack) Mother  . Lupus Mother  . High blood pressure (Hypertension) Mother  . Stroke Father  . High blood pressure (Hypertension) Father  . Lymphoma Sister  . Cancer Sister   Review of Systems:   A 10+ ROS was performed, reviewed, and the pertinent orthopaedic findings are documented in the HPI.   Physical Examination:   BP 130/82  Ht 165.1 cm (5\' 5" )  Wt 98.6 kg (217 lb 6.4 oz)  LMP (LMP Unknown)  BMI 36.18 kg/m   Patient is a well-developed, well-nourished female in no acute distress. Patient has normal mood and affect. Patient is alert and oriented to person, place, and time.   HEENT: Atraumatic, normocephalic. Pupils equal and reactive to light. Extraocular motion intact. Noninjected sclera.  Cardiovascular: Regular rate and rhythm, with no murmurs, rubs, or gallops. Distal pulses palpable.  Respiratory: Lungs clear to auscultation bilaterally.   Hip Exam:  ROM  Right  Flexion/Extension Limited to 80 with hip pain. Limited extension.  90 degree IR Normal IR to 15 degrees with hip pain  90 degree ER Normal ER to 30 degrees with pain     Sensation is intact over the saphenous, lateral cutaneous, superficial fibular,  and deep fibular nerve distributions.  Tests Performed/Reviewed:  X-rays  No new radiographs were obtained today. Previous radiographs from 04/09/19 were reviewed of the right hip and revealed no fractures or dislocations. No soft tissue swelling or joint effusion. Degenerative changes to the right hip with joint space narrowing, subchondral sclerosis, osteophyte formation, subchondral cystic change. No loose bodies. No abnormal bone lesions. Normal-appearing SI joints. No enthesopathic changes around the hip or pelvis. The visualized left hip also shows joint space narrowing, osteophyte formation.  Impression:   ICD-10-CM  1. Primary osteoarthritis of right hip M16.11   Plan:   The patient has end-stage degenerative changes of the right hip. It was explained to the patient that the condition is progressive in nature. Having failed conservative treatment, the patient has elected to proceed with a total joint arthroplasty. The patient will undergo a total joint arthroplasty through anterior approach with Dr. Rudene Christians. The risks of surgery,  including blood clot and infection, were discussed with the patient. Measures to reduce these risks, including the use of anticoagulation, perioperative antibiotics, and early ambulation were discussed. The importance of postoperative physical therapy was discussed with the patient. The patient elects to proceed with surgery. The patient is instructed to stop all blood thinners prior to surgery. The patient is instructed to call the hospital the day before surgery to learn of the proper arrival time.   Contact our office with any questions or concerns. Follow up as indicated, or sooner should any new problems arise, if conditions worsen, or if they are otherwise concerned.   Gwenlyn Fudge, PA-C [facility] [address] [place], [place] [address] Phone: [phone]   Reviewed paper H+P, will be scanned into  chart. No changes noted.

## 2019-06-04 NOTE — Evaluation (Signed)
Physical Therapy Evaluation Patient Details Name: Brenda Mckinney MRN: [id number] DOB: 1960/06/12 Today's Date: 06/04/2019   History of Present Illness  Pt is a 59 yo female diagnosed with osteoarthritis of the right hip and is s/p elective R THA.  PMH includes: asthma, HTN, thyroid CA, hypothyroidism, and Bell's palsy.    Clinical Impression  Pt pleasant and motivated to participate during the session.  Overall pt performed very well during the session, especially considering POD#0 status, and would have been able to amb to the chair if it weren't for nausea causing the pt to require to quickly return to sitting at the EOB.  Pt's SpO2 and HR were WNL during the session.  Pt will benefit from HHPT services upon discharge to safely address deficits listed in patient problem list for decreased caregiver assistance and eventual return to PLOF.      Follow Up Recommendations Home health PT;Supervision for mobility/OOB    Equipment Recommendations  Rolling walker with 5" wheels;3in1 (PT)    Recommendations for Other Services       Precautions / Restrictions Precautions Precautions: Fall;Anterior Hip Precaution Booklet Issued: Yes (comment) Restrictions Weight Bearing Restrictions: Yes RLE Weight Bearing: Weight bearing as tolerated      Mobility  Bed Mobility Overal bed mobility: Needs Assistance Bed Mobility: Supine to Sit;Sit to Supine     Supine to sit: Min assist Sit to supine: Min assist   General bed mobility comments: Min A for RLE control  Transfers Overall transfer level: Needs assistance Equipment used: Rolling walker (2 wheeled) Transfers: Sit to/from Stand Sit to Stand: Min guard;From elevated surface         General transfer comment: Mod verbal and tactile cues for sequencing  Ambulation/Gait Ambulation/Gait assistance: Min guard Gait Distance (Feet): 2 Feet Assistive device: Rolling walker (2 wheeled) Gait Pattern/deviations: Step-to  pattern;Decreased stance time - right Gait velocity: decreased   General Gait Details: Several small steps at the EOB with antalgic pattern and mod lean on the RW for support but no buckling or LOB noted; amb distance limited by nausea  Stairs            Wheelchair Mobility    Modified Rankin (Stroke Patients Only)       Balance Overall balance assessment: Needs assistance Sitting-balance support: Feet supported;Single extremity supported Sitting balance-Leahy Scale: Good     Standing balance support: Bilateral upper extremity supported Standing balance-Leahy Scale: Fair                               Pertinent Vitals/Pain Pain Assessment: 0-10 Pain Score: 7  Pain Location: R hip Pain Descriptors / Indicators: Aching;Sore Pain Intervention(s): Premedicated before session;Monitored during session    [place] expects to be discharged to:: Private residence Living Arrangements: Spouse/significant other Available Help at Discharge: Family;Available 24 hours/day Type of Home: House Home Access: Stairs to enter Entrance Stairs-Rails: Right;Left(Too wide for both) Entrance Stairs-Number of Steps: 3+1 Home Layout: One level Home Equipment: Walker - 4 wheels Additional Comments: Pt has 3 steps to enter with wide bilateral rails and then one step without rails    Prior Function Level of Independence: Independent with assistive device(s)         Comments: Mod Ind amb limited community distances with a rollator or holding a shopping cart during grocery shopping.  No fall history.  Ind with ADLs.     Hand Dominance  Extremity/Trunk Assessment   Upper Extremity Assessment Upper Extremity Assessment: Overall WFL for tasks assessed    Lower Extremity Assessment Lower Extremity Assessment: Generalized weakness;RLE deficits/detail RLE Deficits / Details: BLE sensation and ankle DF/PF AROM and strength WNL RLE: Unable to fully  assess due to pain RLE Sensation: WNL RLE Coordination: WNL       Communication   Communication: No difficulties  Cognition Arousal/Alertness: Awake/alert Behavior During Therapy: WFL for tasks assessed/performed Overall Cognitive Status: Within Functional Limits for tasks assessed                                        General Comments      Exercises Total Joint Exercises Ankle Circles/Pumps: Strengthening;Both;10 reps Quad Sets: Strengthening;Both;10 reps Hip ABduction/ADduction: AAROM;Right;5 reps Straight Leg Raises: AAROM;Right;5 reps Long Arc Quad: AROM;Strengthening;Both;10 Theatre manager in Standing: AROM;Strengthening;Both;10 reps;Standing   Assessment/Plan    PT Assessment Patient needs continued PT services  PT Problem List Decreased strength;Decreased activity tolerance;Decreased balance;Decreased mobility;Decreased knowledge of use of DME;Decreased knowledge of precautions;Pain       PT Treatment Interventions DME instruction;Gait training;Stair training;Functional mobility training;Therapeutic activities;Therapeutic exercise;Balance training;Patient/family education    PT Goals (Current goals can be found in the Care Plan section)  Acute Rehab PT Goals Patient Stated Goal: To walk more PT Goal Formulation: With patient Time For Goal Achievement: 06/17/19 Potential to Achieve Goals: Good    Frequency BID   Barriers to discharge        Co-evaluation               AM-PAC PT "6 Clicks" Mobility  Outcome Measure Help needed turning from your back to your side while in a flat bed without using bedrails?: A Little Help needed moving from lying on your back to sitting on the side of a flat bed without using bedrails?: A Little Help needed moving to and from a bed to a chair (including a wheelchair)?: A Little Help needed standing up from a chair using your arms (e.g., wheelchair or bedside chair)?: A Little Help needed to walk in  hospital room?: A Lot Help needed climbing 3-5 steps with a railing? : Total 6 Click Score: 15    End of Session Equipment Utilized During Treatment: Gait belt Activity Tolerance: Other (comment)(Pt limited by nausea with amb, nursing notified) Patient left: in bed;with family/visitor present;with call bell/phone within reach;with bed alarm set;with SCD's reapplied Nurse Communication: Mobility status;Other (comment)(nausea with amb) PT Visit Diagnosis: Other abnormalities of gait and mobility (R26.89);Muscle weakness (generalized) (M62.81);Pain Pain - Right/Left: Right Pain - part of body: Hip    Time: [id number] PT Time Calculation (min) (ACUTE ONLY): 35 min   Charges:   PT Evaluation $PT Eval Moderate Complexity: 1 Mod PT Treatments $Therapeutic Exercise: 8-22 mins       D. Royetta Asal PT, DPT 06/04/19, 5:18 PM

## 2019-06-04 NOTE — Anesthesia Procedure Notes (Signed)
Spinal  Patient location during procedure: OR Start time: 06/04/2019 9:55 AM End time: 06/04/2019 9:59 AM Staffing Performed: resident/CRNA  Resident/CRNA: Justus Memory, CRNA Preanesthetic Checklist Completed: patient identified, IV checked, site marked, risks and benefits discussed, surgical consent, monitors and equipment checked, pre-op evaluation and timeout performed Spinal Block Patient position: sitting Prep: Betadine Patient monitoring: heart rate, continuous pulse ox, blood pressure and cardiac monitor Approach: midline Location: L4-5 Injection technique: single-shot Needle Needle type: Introducer and Pencan  Needle gauge: 24 G Needle length: 9 cm Additional Notes Negative paresthesia. Negative blood return. Positive free-flowing CSF. Expiration date of kit checked and confirmed. Patient tolerated procedure well, without complications.

## 2019-06-04 NOTE — Progress Notes (Signed)
PHARMACIST - PHYSICIAN ORDER COMMUNICATION   CONCERNING: Simvastatin 40 mg daily and amlodipine - rhabdomyolysis risk      DESCRIPTION:   Patients on amlodipine and simvastatin >20 mg/day have reported cases of rhabdomyolysis. Pharmacy is to assess simvastatin dose. If >20 mg, substitute atorvastatin (Lipitor) 1mg for each 2mg simvastatin.     This patient is ordered simvastatin 40mg and amlodipine 5mg.                                                               ACTION TAKEN: Per protocol pharmacy has discontinued the patient's order for simvastatin and replaced it with Atorvastatin 20 mg.   

## 2019-06-04 NOTE — Anesthesia Postprocedure Evaluation (Signed)
Anesthesia Post Note  Patient: Brenda Mckinney  Procedure(s) Performed: RIGHT TOTAL HIP ARTHROPLASTY ANTERIOR APPROACH (Right Hip)  Patient location during evaluation: PACU Anesthesia Type: Spinal Level of consciousness: awake and alert Pain management: pain level controlled Vital Signs Assessment: post-procedure vital signs reviewed and stable Respiratory status: spontaneous breathing, nonlabored ventilation, respiratory function stable and patient connected to nasal cannula oxygen Cardiovascular status: blood pressure returned to baseline and stable Postop Assessment: no apparent nausea or vomiting Anesthetic complications: no     Last Vitals:  Vitals:   06/04/19 1151 06/04/19 1157  BP:    Pulse: 67   Resp: 16   Temp:  (P) 36.4 C  SpO2: 98%     Last Pain:  Vitals:   06/04/19 0842  TempSrc: Oral  PainSc: 8                  Molli Barrows

## 2019-06-04 NOTE — TOC Initial Note (Signed)
Transition of Care [facility]) - Initial/Assessment Note    Patient Details  Name: Brenda Mckinney MRN: [id number] Date of Birth: [birth year]  [facility] CM/SW Contact:    Elease Hashimoto, LCSW Phone Number: 06/04/2019, 3:22 PM  Clinical Narrative:   Met with pt and husband who is at the bedside. He can assist her and will at DC. Pt is very glad surgery is over. She and husband use the same rollator unsure how. Discussed she may need a regular rw for when goes home from here. She was independent prior to admission and plans to be again. She works full time and her husband does also. Both drive and has no other issues. Will see tomorrow after therapies to confirm equipment needs.              Expected Discharge Plan: [place] Barriers to Discharge: Continued Medical Work up   Patient Goals and CMS Choice Patient states their goals for this hospitalization and ongoing recovery are:: I hope I do well here and go home by the weekend CMS Medicare.gov Compare Post Acute Care list provided to:: Patient Choice offered to / list presented to : Patient  Expected Discharge Plan and Services Expected Discharge Plan: [place] In-house Referral: Clinical Social Work   Post Acute Care Choice: Museum/gallery conservator, Home Health Living arrangements for the past 2 months: [place]: PT [place]: [facility] [employer] Date [place]: 06/04/19 Time [place]: 1520 Representative spoke with at [place]: teresa  Prior Living Arrangements/Services Living arrangements for the past 2 months: [place] with:: Spouse Patient language and need for interpreter reviewed:: No Do you feel safe going back to the place where you live?: Yes      Need for Family Participation in Patient Care: Yes (Comment) Care giver support system in place?:  Yes (comment) Current home services: DME(rollator shares with husband) Criminal Activity/Legal Involvement Pertinent to Current Situation/Hospitalization: No - Comment as needed  Activities of Daily Living Home Assistive Devices/Equipment: Eyeglasses, Other (Comment)(cane and walker) ADL Screening (condition at time of admission) Patient's cognitive ability adequate to safely complete daily activities?: Yes Is the patient deaf or have difficulty hearing?: No Does the patient have difficulty seeing, even when wearing glasses/contacts?: No Does the patient have difficulty concentrating, remembering, or making decisions?: No Patient able to express need for assistance with ADLs?: Yes Does the patient have difficulty dressing or bathing?: No Independently performs ADLs?: Yes (appropriate for developmental age) Does the patient have difficulty walking or climbing stairs?: Yes(Yes d/t pain ) Weakness of Legs: Right Weakness of Arms/Hands: None  Permission Sought/Granted Permission sought to share information with : Facility Sport and exercise psychologist, Family Supports Permission granted to share information with : Yes, Verbal Permission Granted  Share Information with NAME: Ebony Hail  [facility] granted to share info w [facility]  Permission granted to share info w Relationship: Husband  Permission granted to share info w Contact Information: Helene Kelp  Emotional Assessment Appearance:: Appears stated age Attitude/Demeanor/Rapport: Gracious Affect (typically observed): Adaptable, Accepting Orientation: : Oriented to Self, Oriented to Place, Oriented to  Time, Oriented to Situation   Psych Involvement: No (comment)  Admission diagnosis:  Status post total hip replacement, right [O67.124] Patient Active Problem  List   Diagnosis Date Noted  . Status post total hip replacement, right 06/04/2019  . Hamartoma of lung ([place]) 07/25/2015  . Asthma without status asthmaticus 03/09/2014  . HLD  (hyperlipidemia) 03/09/2014  . BP (high blood pressure) 03/09/2014  . Adult hypothyroidism 03/09/2014   PCP:  Baxter Hire, MD Pharmacy:   [facility] [place], [place] - [address] [place] [address] Phone: [phone] Fax: [phone]     Social Determinants of Health (SDOH) Interventions    Readmission Risk Interventions No flowsheet data found.

## 2019-06-04 NOTE — Op Note (Signed)
06/04/2019  11:57 AM  PATIENT:  Brenda Mckinney  59 y.o. female  PRE-OPERATIVE DIAGNOSIS:  Primary osteoarthritis of right hip  POST-OPERATIVE DIAGNOSIS:  Primary osteoarthritis of right hip  PROCEDURE:  Procedure(s): RIGHT TOTAL HIP ARTHROPLASTY ANTERIOR APPROACH (Right)  SURGEON: Laurene Footman, MD  ASSISTANTS: none  ANESTHESIA:   spinal  EBL:  Total I/O In: 1100 [I.V.:900; IV Piggyback:200] Out: 500 [Urine:300; Blood:200]  BLOOD ADMINISTERED:none  DRAINS: Incisional wound VAC   LOCAL MEDICATIONS USED:  MARCAINE    and OTHER Exparel  SPECIMEN:  Source of Specimen:  Right femoral head  DISPOSITION OF SPECIMEN:  PATHOLOGY  COUNTS:  YES  TOURNIQUET:  * No tourniquets in log *  IMPLANTS: Medacta AMIS 1 standard stem, 50 mm Mpact TM cup and liner with ceramic M 28 mm head  DICTATION: .Dragon Dictation   The patient was brought to the operating room and after spinal anesthesia was obtained patient was placed on the operative table with the ipsilateral foot into the Medacta attachment, contralateral leg on a well-padded table. C-arm was brought in and preop template x-ray taken. After prepping and draping in usual sterile fashion appropriate patient identification and timeout procedures were completed. Anterior approach to the hip was obtained and centered over the greater trochanter and TFL muscle. The subcutaneous tissue was incised hemostasis being achieved by electrocautery. TFL fascia was incised and the muscle retracted laterally deep retractor placed. The lateral femoral circumflex vessels were identified and ligated. The anterior capsule was exposed and a capsulotomy performed. The neck was identified and a femoral neck cut carried out with a saw. The head was removed without difficulty and showed sclerotic femoral head and acetabulum. Reaming was carried out to 51 mm and a 52 mm cup trial gave appropriate tightness to the acetabular component a 52 DM cup was impacted  into position. The leg was then externally rotated and ischiofemoral and pubofemoral releases carried out. The femur was sequentially broached to a size 1, size 1 standard wear as had trials were placed and the final components chosen. The 1 standard stem was inserted along with a M ceramic 28 mm head and 50 mm liner. The hip was reduced and was stable the wound was thoroughly irrigated with fibrillar placed along the posterior capsule and medial neck. The deep fascia ws closed using a heavy Quill after infiltration of mixture of Exparel and 30 cc of quarter percent Sensorcaine with epinephrine.3-0 V-loc to close the skin with skin staples. Xeroform and incisional wound VAC patient was sent to recovery in stable condition.   PLAN OF CARE: Admit to inpatient

## 2019-06-04 NOTE — Plan of Care (Signed)

## 2019-06-04 NOTE — Transfer of Care (Signed)
Immediate Anesthesia Transfer of Care Note  Patient: Brenda Mckinney  Procedure(s) Performed: RIGHT TOTAL HIP ARTHROPLASTY ANTERIOR APPROACH (Right Hip)  Patient Location: PACU  Anesthesia Type:Spinal  Level of Consciousness: awake, alert  and oriented  Airway & Oxygen Therapy: Patient Spontanous Breathing  Post-op Assessment: Report given to RN and Post -op Vital signs reviewed and stable  Post vital signs: Reviewed  Last Vitals:  Vitals Value Taken Time  BP    Temp    Pulse 67 06/04/19 1152  Resp 14 06/04/19 1152  SpO2 97 % 06/04/19 1152  Vitals shown include unvalidated device data.  Last Pain:  Vitals:   06/04/19 0842  TempSrc: Oral  PainSc: 8       Patients Stated Pain Goal: 2 ([id number])  Complications: No apparent anesthesia complications

## 2019-06-05 LAB — CBC
HCT: 27.7 % — ABNORMAL LOW (ref 36.0–46.0)
Hemoglobin: 9.6 g/dL — ABNORMAL LOW (ref 12.0–15.0)
MCH: 30.1 pg (ref 26.0–34.0)
MCHC: 34.7 g/dL (ref 30.0–36.0)
MCV: 86.8 fL (ref 80.0–100.0)
Platelets: 232 10*3/uL (ref 150–400)
RBC: 3.19 MIL/uL — ABNORMAL LOW (ref 3.87–5.11)
RDW: 14.7 % (ref 11.5–15.5)
WBC: 6.7 10*3/uL (ref 4.0–10.5)
nRBC: 0 % (ref 0.0–0.2)

## 2019-06-05 LAB — BASIC METABOLIC PANEL
Anion gap: 8 (ref 5–15)
BUN: 11 mg/dL (ref 8–23)
CO2: 26 mmol/L (ref 22–32)
Calcium: 8.1 mg/dL — ABNORMAL LOW (ref 8.9–10.3)
Chloride: 102 mmol/L (ref 98–111)
Creatinine, Ser: 0.76 mg/dL (ref 0.44–1.00)
GFR calc Af Amer: >60 mL/min (ref >60)
GFR calc non Af Amer: >60 mL/min (ref >60)
Glucose, Bld: 139 mg/dL — ABNORMAL HIGH (ref 70–99)
Potassium: 3.5 mmol/L (ref 3.5–5.1)
Sodium: 136 mmol/L (ref 135–145)

## 2019-06-05 MED ORDER — MAGNESIUM HYDROXIDE 400 MG/5ML PO SUSP
30.0000 mL | Freq: Once | ORAL | Status: AC
  Administered 2019-06-05: 30 mL via ORAL
  Filled 2019-06-05: qty 30

## 2019-06-05 MED ORDER — FE FUMARATE-B12-VIT C-FA-IFC PO CAPS
1.0000 | ORAL_CAPSULE | Freq: Two times a day (BID) | ORAL | Status: DC
  Administered 2019-06-05 – 2019-06-07 (×5): 1 via ORAL
  Filled 2019-06-05 (×6): qty 1

## 2019-06-05 NOTE — Progress Notes (Signed)
Physical Therapy Treatment Patient Details Name: Brenda Mckinney MRN: [id number] DOB: 09/25/1960 Today's Date: 06/05/2019    History of Present Illness Pt is a 59 yo female diagnosed with osteoarthritis of the right hip and is s/p elective R THA.  PMH includes: asthma, HTN, thyroid CA, hypothyroidism, and Bell's palsy.    PT Comments    Stood with min guard and ambulated from recliner to bathroom.  Time given but unable to void.  She is able to progress gait into hallway but after 50' she vomits on floor.  RN came to assist.  She is returned to bed with mod a x 1 and medication for nausea given by RN.  Progressing well with limited pain, slow but steady gait.  Nausea primary barrier today.   Follow Up Recommendations  Home health PT;Supervision for mobility/OOB     Equipment Recommendations  Rolling walker with 5" wheels;3in1 (PT)    Recommendations for Other Services       Precautions / Restrictions Precautions Precautions: Fall;Anterior Hip Precaution Booklet Issued: Yes (comment) Restrictions Weight Bearing Restrictions: Yes RLE Weight Bearing: Weight bearing as tolerated    Mobility  Bed Mobility Overal bed mobility: Needs Assistance Bed Mobility: Sit to Supine     Supine to sit: Min assist Sit to supine: Min assist;Mod assist   General bed mobility comments: deferred due to pt up in recliner  Transfers Overall transfer level: Needs assistance Equipment used: Rolling walker (2 wheeled) Transfers: Sit to/from Stand Sit to Stand: Min guard;From elevated surface;Min assist         General transfer comment: deferred 2/2 pt nauseated  Ambulation/Gait Ambulation/Gait assistance: Min guard Gait Distance (Feet): 50 Feet Assistive device: Rolling walker (2 wheeled) Gait Pattern/deviations: Step-to pattern;Decreased stance time - right Gait velocity: decreased   General Gait Details: to bathroom then continues in hallway, limited by vomitting in  hallway.   Stairs             Wheelchair Mobility    Modified Rankin (Stroke Patients Only)       Balance Overall balance assessment: Needs assistance Sitting-balance support: Feet supported Sitting balance-Leahy Scale: Good     Standing balance support: Bilateral upper extremity supported Standing balance-Leahy Scale: Good                              Cognition Arousal/Alertness: Awake/alert Behavior During Therapy: WFL for tasks assessed/performed Overall Cognitive Status: Within Functional Limits for tasks assessed                                        Exercises Total Joint Exercises Ankle Circles/Pumps: Strengthening;Both;10 reps Quad Sets: Strengthening;Both;10 reps Hip ABduction/ADduction: AAROM;Right;5 reps Straight Leg Raises: AAROM;Right;5 reps Long Arc Quad: AROM;Strengthening;Both;10 Theatre manager in Standing: AROM;Strengthening;Both;10 reps;Standing Other Exercises Other Exercises: Pt educated in falls prevention strategies, DME/AE for self-care, compression stocking mgt, home/routines modifications; handout provided to support recall and carryover. Pt verbalized understanding and would benefit from additional education and training to maximize safety and functional participation in ADL upon discharge.    General Comments        Pertinent Vitals/Pain Pain Assessment: Faces Pain Score: 6  Faces Pain Scale: Hurts little more Pain Location: R hip Pain Descriptors / Indicators: Aching;Sore Pain Intervention(s): Limited activity within patient's tolerance;Monitored during session;RN gave pain meds during session  Home Living Family/patient expects to be discharged to:: Private residence Living Arrangements: Spouse/significant other Available Help at Discharge: Family;Available 24 hours/day Type of Home: House Home Access: Stairs to enter Entrance Stairs-Rails: Right;Left(Too wide for both) Home Layout: One  level Home Equipment: Walker - 4 wheels Additional Comments: Pt has 3 steps to enter with wide bilateral rails and then one step without rails    Prior Function Level of Independence: Independent with assistive device(s)      Comments: Mod Ind amb limited community distances with a rollator or holding a shopping cart during grocery shopping.  No fall history.  Ind with ADLs.   PT Goals (current goals can now be found in the care plan section) Progress towards PT goals: Progressing toward goals    Frequency    BID      PT Plan Current plan remains appropriate    Co-evaluation              AM-PAC PT "6 Clicks" Mobility   Outcome Measure  Help needed turning from your back to your side while in a flat bed without using bedrails?: A Little Help needed moving from lying on your back to sitting on the side of a flat bed without using bedrails?: A Little Help needed moving to and from a bed to a chair (including a wheelchair)?: A Little Help needed standing up from a chair using your arms (e.g., wheelchair or bedside chair)?: A Little Help needed to walk in hospital room?: A Little Help needed climbing 3-5 steps with a railing? : A Lot 6 Click Score: 17    End of Session Equipment Utilized During Treatment: Gait belt Activity Tolerance: Patient tolerated treatment well Patient left: with call bell/phone within reach;in bed;with bed alarm set Nurse Communication: Mobility status;Other (comment) Pain - Right/Left: Right Pain - part of body: Hip     Time: 1116-1150 PT Time Calculation (min) (ACUTE ONLY): 34 min  Charges:  $Gait Training: 8-22 mins $Therapeutic Activity: 8-22 mins                    Chesley Noon, PTA 06/05/19, 11:52 AM

## 2019-06-05 NOTE — Progress Notes (Signed)
Physical Therapy Treatment Patient Details Name: Brenda Mckinney MRN: [id number] DOB: 10-07-1960 Today's Date: 06/05/2019    History of Present Illness Pt is a 59 yo female diagnosed with osteoarthritis of the right hip and is s/p elective R THA.  PMH includes: asthma, HTN, thyroid CA, hypothyroidism, and Bell's palsy.    PT Comments    Participated in exercises as described below.  To EOB with min a x 1.  Stood and is able to walk to door and back with RW and min guard.  Slow but steady gait limited by feeling of nausea.  Remained in recliner after session.   Follow Up Recommendations  Home health PT;Supervision for mobility/OOB     Equipment Recommendations  Rolling walker with 5" wheels;3in1 (PT)    Recommendations for Other Services       Precautions / Restrictions Precautions Precautions: Fall;Anterior Hip Precaution Booklet Issued: Yes (comment) Restrictions Weight Bearing Restrictions: Yes RLE Weight Bearing: Weight bearing as tolerated    Mobility  Bed Mobility Overal bed mobility: Needs Assistance Bed Mobility: Supine to Sit     Supine to sit: Min assist        Transfers Overall transfer level: Needs assistance Equipment used: Rolling walker (2 wheeled) Transfers: Sit to/from Stand Sit to Stand: Min guard;From elevated surface;Min assist         General transfer comment: Mod verbal and tactile cues for sequencing  Ambulation/Gait Ambulation/Gait assistance: Min guard Gait Distance (Feet): 20 Feet Assistive device: Rolling walker (2 wheeled) Gait Pattern/deviations: Step-to pattern;Decreased stance time - right Gait velocity: decreased   General Gait Details: Several small steps at the EOB with antalgic pattern and mod lean on the RW for support but no buckling or LOB noted; amb distance limited by nausea   Stairs             Wheelchair Mobility    Modified Rankin (Stroke Patients Only)       Balance Overall balance  assessment: Needs assistance Sitting-balance support: Feet supported;Single extremity supported Sitting balance-Leahy Scale: Good     Standing balance support: Bilateral upper extremity supported Standing balance-Leahy Scale: Fair                              Cognition Arousal/Alertness: Awake/alert Behavior During Therapy: WFL for tasks assessed/performed Overall Cognitive Status: Within Functional Limits for tasks assessed                                        Exercises Total Joint Exercises Ankle Circles/Pumps: Strengthening;Both;10 reps Quad Sets: Strengthening;Both;10 reps Hip ABduction/ADduction: AAROM;Right;5 reps Straight Leg Raises: AAROM;Right;5 reps Long Arc Quad: AROM;Strengthening;Both;10 Theatre manager in Standing: AROM;Strengthening;Both;10 reps;Standing    General Comments        Pertinent Vitals/Pain Pain Assessment: Faces Faces Pain Scale: Hurts little more Pain Location: R hip Pain Descriptors / Indicators: Aching;Sore Pain Intervention(s): Limited activity within patient's tolerance;Monitored during session;Repositioned    Home Living                      Prior Function            PT Goals (current goals can now be found in the care plan section) Progress towards PT goals: Progressing toward goals    Frequency    BID      PT Plan  Current plan remains appropriate    Co-evaluation              AM-PAC PT "6 Clicks" Mobility   Outcome Measure  Help needed turning from your back to your side while in a flat bed without using bedrails?: A Little Help needed moving from lying on your back to sitting on the side of a flat bed without using bedrails?: A Little Help needed moving to and from a bed to a chair (including a wheelchair)?: A Little Help needed standing up from a chair using your arms (e.g., wheelchair or bedside chair)?: A Little Help needed to walk in hospital room?: A Little Help needed  climbing 3-5 steps with a railing? : A Lot 6 Click Score: 17    End of Session Equipment Utilized During Treatment: Gait belt Activity Tolerance: Patient tolerated treatment well Patient left: with call bell/phone within reach;in chair;with chair alarm set;with nursing/sitter in room Nurse Communication: Mobility status;Other (comment) Pain - Right/Left: Right Pain - part of body: Hip     Time: 0913-0929 PT Time Calculation (min) (ACUTE ONLY): 16 min  Charges:  $Gait Training: 8-22 mins                    Chesley Noon, PTA 06/05/19, 9:46 AM

## 2019-06-05 NOTE — Evaluation (Signed)
Occupational Therapy Evaluation Patient Details Name: Brenda Mckinney MRN: [id number] DOB: 1961/01/15 Today's Date: 06/05/2019    History of Present Illness Pt is a 59 yo female diagnosed with osteoarthritis of the right hip and is s/p elective R THA.  PMH includes: asthma, HTN, thyroid CA, hypothyroidism, and Bell's palsy.   Clinical Impression   Pt presents this morning POD1 s/p R total hip arthroplasty agreeable to evaluation, but states that she becomes nauseous with standing and movement. She is independent in ADL/IADL at baseline, however she reports using a rollator for ambulation around her home 2/2 R hip pain prior to surgery. Pt lives with her spouse in a 1 level home with 3 steps with wide bilateral rails + 1 step without a rail to enter, a walk in shower and standard toilet. She currently presents with pain and decreased strength/ROM in her R hip limiting her functional mobility and participation in ADL, particularly transfers, LB dressing, and LB bathing. Pt educated in falls prevention strategies, DME/AE for self-care, compression stocking mgt, and home/routine modifications; handout provided to support recall and carryover. Pt verbalized understanding and would benefit from continued skilled acute OT services for additional education and training to maximize safety and functional participation in ADL upon discharge. Do not anticipate further OT needs beyond hospitalization.       Follow Up Recommendations  No OT follow up    Equipment Recommendations  3 in 1 bedside commode    Recommendations for Other Services       Precautions / Restrictions Precautions Precautions: Fall;Anterior Hip Precaution Booklet Issued: Yes (comment) Restrictions Weight Bearing Restrictions: Yes RLE Weight Bearing: Weight bearing as tolerated      Mobility Bed Mobility Overal bed mobility: Needs Assistance Bed Mobility: Sit to Supine     Supine to sit: Min assist Sit to supine: Min  assist;Mod assist   General bed mobility comments: deferred due to pt up in recliner  Transfers Overall transfer level: Needs assistance Equipment used: Rolling walker (2 wheeled) Transfers: Sit to/from Stand Sit to Stand: Min guard;From elevated surface;Min assist         General transfer comment: deferred 2/2 pt nauseated    Balance Overall balance assessment: Needs assistance Sitting-balance support: Feet supported Sitting balance-Leahy Scale: Good     Standing balance support: Bilateral upper extremity supported Standing balance-Leahy Scale: Good                             ADL either performed or assessed with clinical judgement   ADL Overall ADL's : Needs assistance/impaired             Lower Body Bathing: Minimal assistance;Sitting/lateral leans       Lower Body Dressing: With adaptive equipment;Sit to/from stand;Min guard Lower Body Dressing Details (indicate cue type and reason): mod A for donning compression stockings, family able to provide assist Toilet Transfer: Min guard;Ambulation;RW;BSC;Regular Glass blower/designer Details (indicate cue type and reason): BSC over toilet         Functional mobility during ADLs: Rolling walker;Min guard       Vision Baseline Vision/History: No visual deficits Vision Assessment?: No apparent visual deficits     Perception     Praxis      Pertinent Vitals/Pain Pain Assessment: Faces Pain Score: 6  Faces Pain Scale: Hurts little more Pain Location: R hip Pain Descriptors / Indicators: Aching;Sore Pain Intervention(s): Limited activity within patient's tolerance;Monitored during session;RN gave pain  meds during session     Hand Dominance     Extremity/Trunk Assessment Upper Extremity Assessment Upper Extremity Assessment: Overall WFL for tasks assessed   Lower Extremity Assessment Lower Extremity Assessment: RLE deficits/detail RLE Deficits / Details: expected post-op strength/ROM  deficits       Communication Communication Communication: No difficulties   Cognition Arousal/Alertness: Awake/alert Behavior During Therapy: WFL for tasks assessed/performed Overall Cognitive Status: Within Functional Limits for tasks assessed                                     General Comments       Exercises Total Joint Exercises Ankle Circles/Pumps: Strengthening;Both;10 reps Quad Sets: Strengthening;Both;10 reps Hip ABduction/ADduction: AAROM;Right;5 reps Straight Leg Raises: AAROM;Right;5 reps Long Arc Quad: AROM;Strengthening;Both;10 Theatre manager in Standing: AROM;Strengthening;Both;10 reps;Standing Other Exercises Other Exercises: Pt educated in falls prevention strategies, DME/AE for self-care, compression stocking mgt, home/routines modifications; handout provided to support recall and carryover. Pt verbalized understanding and would benefit from additional education and training to maximize safety and functional participation in ADL upon discharge.   Shoulder Instructions      Home Living Family/patient expects to be discharged to:: Private residence Living Arrangements: Spouse/significant other Available Help at Discharge: Family;Available 24 hours/day Type of Home: House Home Access: Stairs to enter [employer] of Steps: 3+1 Entrance Stairs-Rails: Right;Left(Too wide for both) Home Layout: One level     Bathroom Shower/Tub: Walk-in shower;Tub/shower unit(will use walk-in post surgery)   Bathroom Toilet: Standard     Home Equipment: Walker - 4 wheels   Additional Comments: Pt has 3 steps to enter with wide bilateral rails and then one step without rails      Prior Functioning/Environment Level of Independence: Independent with assistive device(s)        Comments: Mod Ind amb limited community distances with a rollator or holding a shopping cart during grocery shopping.  No fall history.  Ind with ADLs.        OT  Problem List: Decreased strength;Decreased range of motion;Decreased activity tolerance;Pain;Impaired balance (sitting and/or standing);Decreased knowledge of use of DME or AE      OT Treatment/Interventions: Self-care/ADL training;DME and/or AE instruction;Patient/family education;Therapeutic exercise;Therapeutic activities    OT Goals(Current goals can be found in the care plan section) Acute Rehab OT Goals Patient Stated Goal: To go home and walk with less pain post-surgery OT Goal Formulation: With patient Time For Goal Achievement: 06/19/19 Potential to Achieve Goals: Good ADL Goals Pt Will Perform Lower Body Dressing: with modified independence;with adaptive equipment;sit to/from stand Pt Will Transfer to Toilet: with modified independence;ambulating(LRAD for ambulation; BSC over toilet) Additional ADL Goal #1: Pt will independently instruct caregiver on compression sock management routine to maximize adherence to post surgical protocol  OT Frequency: Min 2X/week   Barriers to D/C:            Co-evaluation              AM-PAC OT "6 Clicks" Daily Activity     Outcome Measure Help from another person eating meals?: None Help from another person taking care of personal grooming?: None Help from another person toileting, which includes using toliet, bedpan, or urinal?: A Little Help from another person bathing (including washing, rinsing, drying)?: A Little Help from another person to put on and taking off regular upper body clothing?: None Help from another person to put on and taking off regular  lower body clothing?: A Little 6 Click Score: 21   End of Session    Activity Tolerance: Patient tolerated treatment well Patient left: in chair;with call bell/phone within reach;with chair alarm set  OT Visit Diagnosis: Other abnormalities of gait and mobility (R26.89);Pain Pain - Right/Left: Right Pain - part of body: Hip                Time: [id number] OT Time Calculation  (min): 16 min Charges:  [place], OTS 06/05/19, 2:15 PM

## 2019-06-05 NOTE — TOC Progression Note (Signed)
Transition of Care [facility]) - Progression Note    Patient Details  Name: Brenda Mckinney MRN: [id number] Date of Birth: 01-07-1961  Transition of [facility]) CM/SW Contact  Jacon Whetzel, Gardiner Rhyme, LCSW Phone Number: 06/05/2019, 3:41 PM  Clinical Narrative: Have ordered rw and 3 in 1 delivered to room. Pt doing better today and not having much pain. [facility] to provide follow up. Husband here and supportive.      Expected Discharge Plan: [place] Barriers to Discharge: Continued Medical Work up  Expected Discharge Plan and Services Expected Discharge Plan: [place] In-house Referral: Clinical Social Work   Post Acute Care Choice: Museum/gallery conservator, Home Health Living arrangements for the past 2 months: [place]: PT [place]: [facility] [employer] Date Agency [place]: 06/04/19 Time [place]: [place] spoke with at [place]: teresa   Social Determinants of Health ([place]) Interventions    Readmission Risk Interventions No flowsheet data found.

## 2019-06-05 NOTE — Progress Notes (Signed)
   Subjective: 1 Day Post-Op Procedure(s) (LRB): RIGHT TOTAL HIP ARTHROPLASTY ANTERIOR APPROACH (Right) Patient reports pain as mild.   Patient is well, and has had no acute complaints or problems Denies any CP, SOB, ABD pain. We will continue therapy today.  Plan is to go Home after hospital stay.  Objective: Vital signs in last 24 hours: Temp:  [96.9 F (36.1 C)-98.9 F (37.2 C)] 98.9 F (37.2 C) (05/07 0524) Pulse Rate:  [50-75] 60 (05/07 0524) Resp:  [12-20] 16 (05/07 0524) BP: (93-158)/(67-89) 115/71 (05/07 0524) SpO2:  [95 %-100 %] 98 % (05/07 0524) Weight:  [98.4 kg] 98.4 kg (05/06 0842)  Intake/Output from previous day: 05/06 0701 - 05/07 0700 In: 2895.9 [P.O.:290; I.V.:2205.8; IV Piggyback:400.1] Out: 2200 [Urine:2000; Blood:200] Intake/Output this shift: No intake/output data recorded.  Recent Labs    06/04/19 1411 06/05/19 0447  HGB 11.1* 9.6*   Recent Labs    06/04/19 1411 06/05/19 0447  WBC 9.8 6.7  RBC 3.73* 3.19*  HCT 33.5* 27.7*  PLT 270 232   Recent Labs    06/04/19 1411 06/05/19 0447  NA  --  136  K  --  3.5  CL  --  102  CO2  --  26  BUN  --  11  CREATININE 0.69 0.76  GLUCOSE  --  139*  CALCIUM  --  8.1*   No results for input(s): LABPT, INR in the last 72 hours.  EXAM General - Patient is Alert, Appropriate and Oriented Extremity - Neurovascular intact Sensation intact distally Intact pulses distally Dorsiflexion/Plantar flexion intact No cellulitis present Compartment soft Dressing - dressing C/D/I and no drainage, prevena intact with out drainage Motor Function - intact, moving foot and toes well on exam.   Past Medical History:  Diagnosis Date  . Arthritis   . Asthma   . Bell's palsy   . Cancer ([place])    thyroid  . Hyperlipemia   . Hypertension   . Hypothyroidism   . Stroke ([place])    DATE UNKNOWN  . Thyroid disease     Assessment/Plan:   1 Day Post-Op Procedure(s) (LRB): RIGHT TOTAL HIP ARTHROPLASTY ANTERIOR  APPROACH (Right) Active Problems:   Status post total hip replacement, right   Acute post op blood loss anemia   Estimated body mass index is 36.11 kg/m as calculated from the following:   Height as of this encounter: 5\' 5"  (1.651 m).   Weight as of this encounter: 98.4 kg. Advance diet Up with therapy  Acute post op blood loss anemia - Hgb 9.6. Start iron supplement. Recheck labs in the am VSS Pain well controlled Needs BM CM to assist with discharge  DVT Prophylaxis - Aspirin, Lovenox, TED hose and SCDs Weight-Bearing as tolerated to right leg   T. Rachelle Hora, PA-C [place] 06/05/2019, 7:48 AM

## 2019-06-06 LAB — CBC
HCT: 27.4 % — ABNORMAL LOW (ref 36.0–46.0)
Hemoglobin: 9.2 g/dL — ABNORMAL LOW (ref 12.0–15.0)
MCH: 30.5 pg (ref 26.0–34.0)
MCHC: 33.6 g/dL (ref 30.0–36.0)
MCV: 90.7 fL (ref 80.0–100.0)
Platelets: 252 10*3/uL (ref 150–400)
RBC: 3.02 MIL/uL — ABNORMAL LOW (ref 3.87–5.11)
RDW: 14.8 % (ref 11.5–15.5)
WBC: 9.2 10*3/uL (ref 4.0–10.5)
nRBC: 0 % (ref 0.0–0.2)

## 2019-06-06 MED ORDER — MAGNESIUM HYDROXIDE 400 MG/5ML PO SUSP
30.0000 mL | Freq: Once | ORAL | Status: AC
  Administered 2019-06-06: 30 mL via ORAL
  Filled 2019-06-06: qty 30

## 2019-06-06 MED ORDER — SCOPOLAMINE 1 MG/3DAYS TD PT72
1.0000 | MEDICATED_PATCH | TRANSDERMAL | Status: DC
  Administered 2019-06-06: 1.5 mg via TRANSDERMAL
  Filled 2019-06-06: qty 1

## 2019-06-06 NOTE — Progress Notes (Signed)
   Subjective: 2 Days Post-Op Procedure(s) (LRB): RIGHT TOTAL HIP ARTHROPLASTY ANTERIOR APPROACH (Right) Patient reports pain as mild.   Patient is well, and has had no acute complaints or problems Denies any CP, SOB, ABD pain. We will continue therapy today.  Plan is to go Home after hospital stay.  Objective: Vital signs in last 24 hours: Temp:  [98.8 F (37.1 C)-98.9 F (37.2 C)] 98.9 F (37.2 C) (05/07 2323) Pulse Rate:  [60-70] 70 (05/07 2323) Resp:  [18] 18 (05/07 2323) BP: (118-143)/(72-76) 143/76 (05/07 2323) SpO2:  [93 %-97 %] 97 % (05/07 2323)  Intake/Output from previous day: 05/07 0701 - 05/08 0700 In: 1363.7 [P.O.:480; I.V.:883.7] Out: 700 [Urine:700] Intake/Output this shift: No intake/output data recorded.  Recent Labs    06/04/19 1411 06/05/19 0447 06/06/19 0434  HGB 11.1* 9.6* 9.2*   Recent Labs    06/05/19 0447 06/06/19 0434  WBC 6.7 9.2  RBC 3.19* 3.02*  HCT 27.7* 27.4*  PLT 232 252   Recent Labs    06/04/19 1411 06/05/19 0447  NA  --  136  K  --  3.5  CL  --  102  CO2  --  26  BUN  --  11  CREATININE 0.69 0.76  GLUCOSE  --  139*  CALCIUM  --  8.1*   No results for input(s): LABPT, INR in the last 72 hours.  EXAM General - Patient is Alert, Appropriate and Oriented Extremity - Neurovascular intact Sensation intact distally Intact pulses distally Dorsiflexion/Plantar flexion intact No cellulitis present Compartment soft Dressing - dressing C/D/I and no drainage, prevena intact. 25cc drainage Motor Function - intact, moving foot and toes well on exam.   Past Medical History:  Diagnosis Date  . Arthritis   . Asthma   . Bell's palsy   . Cancer ([place])    thyroid  . Hyperlipemia   . Hypertension   . Hypothyroidism   . Stroke ([place])    DATE UNKNOWN  . Thyroid disease     Assessment/Plan:   2 Days Post-Op Procedure(s) (LRB): RIGHT TOTAL HIP ARTHROPLASTY ANTERIOR APPROACH (Right) Active Problems:   Status post total hip  replacement, right   Acute post op blood loss anemia   Estimated body mass index is 36.11 kg/m as calculated from the following:   Height as of this encounter: 5\' 5"  (1.651 m).   Weight as of this encounter: 98.4 kg. Advance diet Up with therapy  Acute post op blood loss anemia - Hgb stable,continue with iron supplement. VSS Pain well controlled Needs BM CM to assist with discharge to home with HHPT tomorrow  DVT Prophylaxis - Aspirin, Lovenox, TED hose and SCDs Weight-Bearing as tolerated to right leg   T. Rachelle Hora, PA-C [place] 06/06/2019, 7:28 AM

## 2019-06-06 NOTE — Progress Notes (Signed)
Physical Therapy Treatment Patient Details Name: Brenda Mckinney MRN: [id number] DOB: 04-01-1960 Today's Date: 06/06/2019    History of Present Illness Pt is a 59 yo female diagnosed with osteoarthritis of the right hip and is s/p elective R THA.  PMH includes: asthma, HTN, thyroid CA, hypothyroidism, and Bell's palsy.    PT Comments    Pt was supine in bed upon arriving. She agrees to PT session and reports 5/20 pain at rest that elevated to 7/10 with movements and wt bearing.  Still having c/o nausea but no vomiting during session. She required min assist to exit bed with assistance with RLE advancement. Stood and ambulated with CGA. Was able to ambulate full lap in hallway ~ 160 ft with slow antalgic gait pattern. Encouraged stair training but pt requested to perform in future sessions. Once returned to room, pt ambulated into BR and was able to perform self care for hygiene after. She was repositioned up in recliner at conclusion of session with chair alarm in place, call bell in reach, and RN aware of pt's abilities. Will return later this date to continue to progress pt per POC. Recommend DC to home with HHPT when medically stable.      Follow Up Recommendations  Home health PT;Supervision for mobility/OOB     Equipment Recommendations  Rolling walker with 5" wheels;3in1 (PT)    Recommendations for Other Services       Precautions / Restrictions Precautions Precautions: Fall;Anterior Hip Precaution Booklet Issued: Yes (comment) Restrictions Weight Bearing Restrictions: Yes RLE Weight Bearing: Weight bearing as tolerated    Mobility  Bed Mobility Overal bed mobility: Needs Assistance Bed Mobility: Supine to Sit     Supine to sit: Min assist     General bed mobility comments: Increased time 2/2 to pain, min assist for RLE progression.  Transfers Overall transfer level: Needs assistance Equipment used: Rolling walker (2 wheeled) Transfers: Sit to/from Stand Sit  to Stand: Min guard;From elevated surface         General transfer comment: Pt demonstrated safe ability to STS from EOB and from elevated BSC height. vcs for technique only, no physical lifting required.  Ambulation/Gait Ambulation/Gait assistance: Min guard Gait Distance (Feet): 160 Feet Assistive device: Rolling walker (2 wheeled) Gait Pattern/deviations: Step-through pattern;Antalgic Gait velocity: decreased   General Gait Details: pt was able to ambulate one lap in hallway without LOB or unsteadiness. She does ambulate with slow cadence but was very pleased she was able to ambulate as far as she could. encouraged stair training but pt requested not performing this session.   Stairs             Wheelchair Mobility    Modified Rankin (Stroke Patients Only)       Balance Overall balance assessment: Needs assistance Sitting-balance support: Feet supported Sitting balance-Leahy Scale: Good Sitting balance - Comments: no LOB seated EOB   Standing balance support: Bilateral upper extremity supported Standing balance-Leahy Scale: Good Standing balance comment: no LOB in standing, able to self wipe after having BM/urination                            Cognition Arousal/Alertness: Awake/alert Behavior During Therapy: WFL for tasks assessed/performed Overall Cognitive Status: Within Functional Limits for tasks assessed  General Comments: Pt is A and O x 4. agreeable to PT session and cooperative and pleasant throughout.      Exercises      General Comments        Pertinent Vitals/Pain Pain Assessment: 0-10 Pain Score: 7  Faces Pain Scale: Hurts a little bit Pain Location: R hip Pain Descriptors / Indicators: Aching;Sore Pain Intervention(s): Limited activity within patient's tolerance;Monitored during session;Premedicated before session;Repositioned    Home Living                      Prior  Function            PT Goals (current goals can now be found in the care plan section) Acute Rehab PT Goals Patient Stated Goal: To go home and walk with less pain post-surgery Progress towards PT goals: Progressing toward goals    Frequency    BID      PT Plan Current plan remains appropriate    Co-evaluation              AM-PAC PT "6 Clicks" Mobility   Outcome Measure  Help needed turning from your back to your side while in a flat bed without using bedrails?: A Little Help needed moving from lying on your back to sitting on the side of a flat bed without using bedrails?: A Little Help needed moving to and from a bed to a chair (including a wheelchair)?: A Little Help needed standing up from a chair using your arms (e.g., wheelchair or bedside chair)?: A Little Help needed to walk in hospital room?: A Little Help needed climbing 3-5 steps with a railing? : A Lot 6 Click Score: 17    End of Session Equipment Utilized During Treatment: Gait belt Activity Tolerance: Patient tolerated treatment well Patient left: in chair;with call bell/phone within reach;with chair alarm set Nurse Communication: Mobility status;Other (comment) PT Visit Diagnosis: Other abnormalities of gait and mobility (R26.89);Muscle weakness (generalized) (M62.81);Pain Pain - Right/Left: Right Pain - part of body: Hip     Time: [id number] PT Time Calculation (min) (ACUTE ONLY): 39 min  Charges:  $Gait Training: 23-37 mins $Therapeutic Activity: 8-22 mins                     Julaine Fusi PTA 06/06/19, 11:55 AM

## 2019-06-06 NOTE — Progress Notes (Signed)
Physical Therapy Treatment Patient Details Name: Brenda Mckinney MRN: [id number] DOB: 12/18/1960 Today's Date: 06/06/2019    History of Present Illness Pt is a 59 yo female diagnosed with osteoarthritis of the right hip and is s/p elective R THA.  PMH includes: asthma, HTN, thyroid CA, hypothyroidism, and Bell's palsy.    PT Comments    Pt was asleep in recliner upon arriving. She easily awakes and agrees to PT session. Pt requested to perform stair training in the morning prior to DC if medically stable. Unwilling to ambulate into halls or try stairs this afternoon. She did ambulate 160 + ft this morning with RW without LOB or unsteadiness. This afternoon she was able to stand and get back into bed with min assist. She was able to tolerate performing there ex handout with only minimal assistance to perform straight leg raise only. Was able to actively perform all other exercises.RN in room at conclusion of session with Pt in bed + with bed alarm in place, call bell in reach, and SCDs reapplied. Will trial stair training next session.    Follow Up Recommendations  Home health PT;Supervision for mobility/OOB     Equipment Recommendations  Rolling walker with 5" wheels;3in1 (PT)    Recommendations for Other Services       Precautions / Restrictions Precautions Precautions: Fall;Anterior Hip Precaution Booklet Issued: Yes (comment) Restrictions Weight Bearing Restrictions: Yes RLE Weight Bearing: Weight bearing as tolerated    Mobility  Bed Mobility Overal bed mobility: Needs Assistance Bed Mobility: Sit to Supine       Sit to supine: Min assist   General bed mobility comments: Incraesed time to perform + min assist for progression of RLE into bed  Transfers Overall transfer level: Needs assistance Equipment used: Rolling walker (2 wheeled) Transfers: Sit to/from Stand Sit to Stand: Min guard         General transfer comment: Pt was able to STS from recliner with  CGA + vcs for technique, fwd wt shift, and hand placement.   Ambulation/Gait Ambulation/Gait assistance: Supervision Gait Distance (Feet): 3 Feet Assistive device: Rolling walker (2 wheeled) Gait Pattern/deviations: Step-to pattern Gait velocity: decreased   General Gait Details: Pt was able to safely stand and ambulate ~ 3 ft from recliner to EOB. No LOB or unsteadiness   Stairs             Wheelchair Mobility    Modified Rankin (Stroke Patients Only)       Balance Overall balance assessment: Needs assistance Sitting-balance support: Feet supported Sitting balance-Leahy Scale: Good Sitting balance - Comments: no LOB seated EOB   Standing balance support: Bilateral upper extremity supported Standing balance-Leahy Scale: Good                              Cognition Arousal/Alertness: Awake/alert Behavior During Therapy: WFL for tasks assessed/performed Overall Cognitive Status: Within Functional Limits for tasks assessed                                 General Comments: Pt was asleep in recliner upon arriving. She is startled upon entry but agrees to PT session. requested not to ambulate/perform stairs this afternoon. She does agree to performing ther ex handout.      Exercises Total Joint Exercises Ankle Circles/Pumps: Strengthening;Both;10 reps Quad Sets: Strengthening;Both;10 reps Gluteal Sets: AROM;10 reps Towel Squeeze: AROM;10  reps Short Arc Quad: AROM;10 reps Heel Slides: AROM;10 reps Straight Leg Raises: AAROM;Right;5 reps    General Comments        Pertinent Vitals/Pain Pain Assessment: 0-10 Pain Score: 4  Faces Pain Scale: Hurts a little bit Pain Location: R hip Pain Descriptors / Indicators: Aching;Sore Pain Intervention(s): Limited activity within patient's tolerance;Monitored during session;Premedicated before session    Home Living                      Prior Function            PT Goals (current  goals can now be found in the care plan section) Acute Rehab PT Goals Patient Stated Goal: To go home Progress towards PT goals: Progressing toward goals    Frequency    BID      PT Plan Current plan remains appropriate    Co-evaluation              AM-PAC PT "6 Clicks" Mobility   Outcome Measure  Help needed turning from your back to your side while in a flat bed without using bedrails?: A Little Help needed moving from lying on your back to sitting on the side of a flat bed without using bedrails?: A Little Help needed moving to and from a bed to a chair (including a wheelchair)?: A Little Help needed standing up from a chair using your arms (e.g., wheelchair or bedside chair)?: A Little Help needed to walk in hospital room?: A Little Help needed climbing 3-5 steps with a railing? : A Lot 6 Click Score: 17    End of Session Equipment Utilized During Treatment: Gait belt Activity Tolerance: Patient tolerated treatment well Patient left: in bed;with call bell/phone within reach;with nursing/sitter in room;with bed alarm set;with SCD's reapplied Nurse Communication: Mobility status;Other (comment) PT Visit Diagnosis: Other abnormalities of gait and mobility (R26.89);Muscle weakness (generalized) (M62.81);Pain Pain - Right/Left: Right Pain - part of body: Hip     Time: [id number] PT Time Calculation (min) (ACUTE ONLY): 23 min  Charges:  $Therapeutic Exercise: 8-22 mins $Therapeutic Activity: 8-22 mins                     Julaine Fusi PTA 06/06/19, 4:02 PM

## 2019-06-07 MED ORDER — ENOXAPARIN SODIUM 40 MG/0.4ML ~~LOC~~ SOLN: 40.0000 mg | SUBCUTANEOUS | 0 refills | Status: DC

## 2019-06-07 MED ORDER — MAGNESIUM HYDROXIDE 400 MG/5ML PO SUSP
30.0000 mL | Freq: Once | ORAL | Status: AC
  Administered 2019-06-07: 30 mL via ORAL
  Filled 2019-06-07: qty 30

## 2019-06-07 MED ORDER — MAGNESIUM CITRATE PO SOLN
1.0000 | Freq: Once | ORAL | Status: DC
  Filled 2019-06-07: qty 296

## 2019-06-07 MED ORDER — TRAMADOL HCL 50 MG PO TABS: 50.0000 mg | ORAL_TABLET | Freq: Four times a day (QID) | ORAL | 0 refills | Status: DC | PRN

## 2019-06-07 MED ORDER — METHOCARBAMOL 500 MG PO TABS: 500.0000 mg | ORAL_TABLET | Freq: Four times a day (QID) | ORAL | 0 refills | Status: DC | PRN

## 2019-06-07 MED ORDER — OXYCODONE HCL 5 MG PO TABS: 5.0000 mg | ORAL_TABLET | ORAL | 0 refills | Status: DC | PRN

## 2019-06-07 MED ORDER — DOCUSATE SODIUM 100 MG PO CAPS: 100.0000 mg | ORAL_CAPSULE | Freq: Two times a day (BID) | ORAL | 0 refills | Status: DC

## 2019-06-07 NOTE — TOC Transition Note (Signed)
Transition of Care [facility]) - CM/SW Discharge Note   Patient Details  Name: Brenda Mckinney MRN: [id number] Date of Birth: Oct 15, 1960  [facility] CM/SW Contact:  Boris Sharper, LCSW Phone Number: 06/07/2019, 9:46 AM   Clinical Narrative:    Pt medically stable for discharge per MD. CSW spoke with pt to follow up on DME needs, pt stated that he already had a RW and 3n1. CSW notified Drue Novel at [facility] of discharge. [facility] for [facility] PT and OT.   Final next level of care: [place] Barriers to Discharge: No Barriers Identified   Patient Goals and CMS Choice Patient states their goals for this hospitalization and ongoing recovery are:: I hope I do well here and go home by the weekend CMS Medicare.gov Compare Post Acute Care list provided to:: Patient Choice offered to / list presented to : Patient  Discharge Placement                Patient to be transferred to facility by: Spouse Name of family member notified: Ebony Hail Patient and family notified of of transfer: 06/07/19  Discharge Plan and Services In-house Referral: Clinical Social Work   Post Acute Care Choice: Museum/gallery conservator, Home Health                    HH Arranged: PT, OT [place] Agency: [facility] [employer] Date [place]: 06/07/19 Time [place]: 0945 Representative spoke with at [place]: [place] ([place]) Interventions     Readmission Risk Interventions No flowsheet data found.

## 2019-06-07 NOTE — Progress Notes (Signed)
Physical Therapy Treatment Patient Details Name: Brenda Mckinney MRN: [id number] DOB: 27-Nov-1960 Today's Date: 06/07/2019    History of Present Illness Pt is a 59 yo female diagnosed with osteoarthritis of the right hip and is s/p elective R THA.  PMH includes: asthma, HTN, thyroid CA, hypothyroidism, and Bell's palsy.    PT Comments    Pt ready for stair training this pm.  Gait to/from stairs with min guard and RW.  Improved confidence this pm on stairs.  Returned to room to void then assisted to bed per her request for suppository with nursing.    Follow Up Recommendations  Home health PT;Supervision for mobility/OOB     Equipment Recommendations  Rolling walker with 5" wheels;3in1 (PT)    Recommendations for Other Services       Precautions / Restrictions Precautions Precautions: Fall;Anterior Hip Precaution Booklet Issued: Yes (comment) Restrictions Weight Bearing Restrictions: Yes RLE Weight Bearing: Weight bearing as tolerated    Mobility  Bed Mobility Overal bed mobility: Needs Assistance Bed Mobility: Sit to Supine     Supine to sit: Min assist Sit to supine: Min assist;Mod assist   General bed mobility comments: assist for RLE to EOB on Left side as at home.  Transfers Overall transfer level: Needs assistance Equipment used: Rolling walker (2 wheeled) Transfers: Sit to/from Stand Sit to Stand: Min guard            Ambulation/Gait Ambulation/Gait assistance: Supervision;Min guard Gait Distance (Feet): 50 Feet Assistive device: Rolling walker (2 wheeled) Gait Pattern/deviations: Step-to pattern Gait velocity: decreased       Stairs Stairs: Yes Stairs assistance: Min guard Stair Management: One rail Right;Step to pattern Number of Stairs: 4 General stair comments: more confidence this pm   Wheelchair Mobility    Modified Rankin (Stroke Patients Only)       Balance Overall balance assessment: Needs assistance Sitting-balance  support: Feet supported Sitting balance-Leahy Scale: Good Sitting balance - Comments: no LOB seated EOB   Standing balance support: Bilateral upper extremity supported Standing balance-Leahy Scale: Good                              Cognition Arousal/Alertness: Awake/alert Behavior During Therapy: WFL for tasks assessed/performed Overall Cognitive Status: Within Functional Limits for tasks assessed                                        Exercises Total Joint Exercises Ankle Circles/Pumps: Strengthening;Both;10 reps Quad Sets: Strengthening;Both;10 reps Gluteal Sets: AROM;10 reps Towel Squeeze: AROM;10 reps Short Arc Quad: AROM;10 reps Heel Slides: AROM;10 reps Straight Leg Raises: AAROM;Right;5 reps Other Exercises Other Exercises: to commode to void.    General Comments        Pertinent Vitals/Pain Pain Assessment: Faces Faces Pain Scale: Hurts a little bit Pain Location: R hip Pain Descriptors / Indicators: Aching;Sore Pain Intervention(s): Repositioned;Limited activity within patient's tolerance;Monitored during session    Home Living                      Prior Function            PT Goals (current goals can now be found in the care plan section) Progress towards PT goals: Progressing toward goals    Frequency    BID      PT Plan Current plan  remains appropriate    Co-evaluation              AM-PAC PT "6 Clicks" Mobility   Outcome Measure  Help needed turning from your back to your side while in a flat bed without using bedrails?: A Little Help needed moving from lying on your back to sitting on the side of a flat bed without using bedrails?: A Little Help needed moving to and from a bed to a chair (including a wheelchair)?: A Little Help needed standing up from a chair using your arms (e.g., wheelchair or bedside chair)?: A Little Help needed to walk in hospital room?: A Little Help needed climbing 3-5  steps with a railing? : A Little 6 Click Score: 18    End of Session Equipment Utilized During Treatment: Gait belt Activity Tolerance: Patient tolerated treatment well Patient left: in bed;with call bell/phone within reach;with bed alarm set Nurse Communication: Mobility status;Other (comment) Pain - Right/Left: Right Pain - part of body: Hip     Time: [id number] PT Time Calculation (min) (ACUTE ONLY): 31 min  Charges:  $Gait Training: 8-22 mins $Therapeutic Exercise: 8-22 mins $Therapeutic Activity: 8-22 mins                    Chesley Noon, PTA 06/07/19, 2:03 PM

## 2019-06-07 NOTE — Discharge Instructions (Signed)

## 2019-06-07 NOTE — Progress Notes (Signed)
   Subjective: 3 Days Post-Op Procedure(s) (LRB): RIGHT TOTAL HIP ARTHROPLASTY ANTERIOR APPROACH (Right) Patient reports pain as mild.   Patient is well, and has had no acute complaints or problems Denies any CP, SOB, ABD pain. We will continue therapy today.  Plan is to go Home after hospital stay.  Objective: Vital signs in last 24 hours: Temp:  [98.3 F (36.8 C)-98.6 F (37 C)] 98.6 F (37 C) (05/09 0817) Pulse Rate:  [68-70] 70 (05/09 0817) Resp:  [16] 16 (05/09 0817) BP: (116-129)/(66-69) 116/69 (05/09 0817) SpO2:  [97 %-98 %] 98 % (05/09 0817)  Intake/Output from previous day: 05/08 0701 - 05/09 0700 In: 240 [P.O.:240] Out: 575 [Urine:575] Intake/Output this shift: No intake/output data recorded.  Recent Labs    06/04/19 1411 06/05/19 0447 06/06/19 0434  HGB 11.1* 9.6* 9.2*   Recent Labs    06/05/19 0447 06/06/19 0434  WBC 6.7 9.2  RBC 3.19* 3.02*  HCT 27.7* 27.4*  PLT 232 252   Recent Labs    06/04/19 1411 06/05/19 0447  NA  --  136  K  --  3.5  CL  --  102  CO2  --  26  BUN  --  11  CREATININE 0.69 0.76  GLUCOSE  --  139*  CALCIUM  --  8.1*   No results for input(s): LABPT, INR in the last 72 hours.  EXAM General - Patient is Alert, Appropriate and Oriented Extremity - Neurovascular intact Sensation intact distally Intact pulses distally Dorsiflexion/Plantar flexion intact No cellulitis present Compartment soft Dressing - dressing C/D/I and no drainage, prevena intact. 25cc drainage Motor Function - intact, moving foot and toes well on exam.   Past Medical History:  Diagnosis Date  . Arthritis   . Asthma   . Bell's palsy   . Cancer ([place])    thyroid  . Hyperlipemia   . Hypertension   . Hypothyroidism   . Stroke ([place])    DATE UNKNOWN  . Thyroid disease     Assessment/Plan:   3 Days Post-Op Procedure(s) (LRB): RIGHT TOTAL HIP ARTHROPLASTY ANTERIOR APPROACH (Right) Active Problems:   Status post total hip replacement,  right   Acute post op blood loss anemia   Estimated body mass index is 36.11 kg/m as calculated from the following:   Height as of this encounter: 5\' 5"  (1.651 m).   Weight as of this encounter: 98.4 kg. Advance diet Up with therapy  Acute post op blood loss anemia - Hgb stable,continue with iron supplement. VSS Pain well controlled Needs BM CM to assist with discharge to home with HHPT today pending bowel movement and good progress of physical therapy.  DVT Prophylaxis - Aspirin, Lovenox, TED hose and SCDs Weight-Bearing as tolerated to right leg   T. Rachelle Hora, PA-C [place] 06/07/2019, 9:08 AM

## 2019-06-07 NOTE — Progress Notes (Signed)
Physical Therapy Treatment Patient Details Name: Brenda Mckinney MRN: [id number] DOB: 05-Jun-1960 Today's Date: 06/07/2019    History of Present Illness Pt is a 59 yo female diagnosed with osteoarthritis of the right hip and is s/p elective R THA.  PMH includes: asthma, HTN, thyroid CA, hypothyroidism, and Bell's palsy.    PT Comments    Participated in exercises as described below.  Min assist for OOB.  Pt stated she gets up on left at home so we practiced this am.  Will need light assist from husband and she stated he will be able to provide it.  Stood and able to walk to PT gym for stair training and 150' of gait.  She is somewhat hesitant on stairs but is able to complete with +1 assist.  Asks to practice again before discharge.  Will return after lunch for continued training.  RN aware.   Follow Up Recommendations  Home health PT;Supervision for mobility/OOB     Equipment Recommendations  Rolling walker with 5" wheels;3in1 (PT)    Recommendations for Other Services       Precautions / Restrictions Precautions Precautions: Fall;Anterior Hip Precaution Booklet Issued: Yes (comment) Restrictions Weight Bearing Restrictions: Yes RLE Weight Bearing: Weight bearing as tolerated    Mobility  Bed Mobility Overal bed mobility: Needs Assistance Bed Mobility: Supine to Sit     Supine to sit: Min assist     General bed mobility comments: assist for RLE to EOB on Left side as at home.  Transfers Overall transfer level: Needs assistance Equipment used: Rolling walker (2 wheeled) Transfers: Sit to/from Stand Sit to Stand: Min guard            Ambulation/Gait Ambulation/Gait assistance: Supervision;Min guard Gait Distance (Feet): 160 Feet Assistive device: Rolling walker (2 wheeled) Gait Pattern/deviations: Step-to pattern Gait velocity: decreased       Stairs Stairs: Yes Stairs assistance: Min guard;Min assist Stair Management: One rail Right Number of  Stairs: 4 General stair comments: some hesitance with stairs.  Will practice again this pm before discharge   Wheelchair Mobility    Modified Rankin (Stroke Patients Only)       Balance Overall balance assessment: Needs assistance Sitting-balance support: Feet supported Sitting balance-Leahy Scale: Good Sitting balance - Comments: no LOB seated EOB   Standing balance support: Bilateral upper extremity supported Standing balance-Leahy Scale: Good                              Cognition Arousal/Alertness: Awake/alert Behavior During Therapy: WFL for tasks assessed/performed Overall Cognitive Status: Within Functional Limits for tasks assessed                                        Exercises Total Joint Exercises Ankle Circles/Pumps: Strengthening;Both;10 reps Quad Sets: Strengthening;Both;10 reps Gluteal Sets: AROM;10 reps Towel Squeeze: AROM;10 reps Short Arc Quad: AROM;10 reps Heel Slides: AROM;10 reps Straight Leg Raises: AAROM;Right;5 reps    General Comments        Pertinent Vitals/Pain Pain Assessment: Faces Faces Pain Scale: Hurts little more Pain Location: R hip Pain Descriptors / Indicators: Aching;Sore Pain Intervention(s): Limited activity within patient's tolerance;Monitored during session;Premedicated before session    Home Living                      Prior Function  PT Goals (current goals can now be found in the care plan section) Progress towards PT goals: Progressing toward goals    Frequency    BID      PT Plan Current plan remains appropriate    Co-evaluation              AM-PAC PT "6 Clicks" Mobility   Outcome Measure  Help needed turning from your back to your side while in a flat bed without using bedrails?: A Little Help needed moving from lying on your back to sitting on the side of a flat bed without using bedrails?: A Little Help needed moving to and from a bed to a  chair (including a wheelchair)?: A Little Help needed standing up from a chair using your arms (e.g., wheelchair or bedside chair)?: A Little Help needed to walk in hospital room?: A Little Help needed climbing 3-5 steps with a railing? : A Little 6 Click Score: 18    End of Session Equipment Utilized During Treatment: Gait belt   Patient left: in chair;with call bell/phone within reach;with chair alarm set Nurse Communication: Mobility status;Other (comment) Pain - Right/Left: Right Pain - part of body: Hip     Time: [id number] PT Time Calculation (min) (ACUTE ONLY): 28 min  Charges:  $Gait Training: 8-22 mins $Therapeutic Exercise: 8-22 mins                    Chesley Noon, PTA 06/07/19, 11:52 AM

## 2019-06-07 NOTE — Plan of Care (Signed)

## 2019-06-07 NOTE — Discharge Summary (Signed)
Physician Discharge Summary  Patient ID: Brenda Mckinney MRN: [id number] DOB/AGE: 10/04/1960 59 y.o.  Admit date: 06/04/2019 Discharge date: 06/07/2019  Admission Diagnoses:  Status post total hip replacement, right [Z96.641]   Discharge Diagnoses: Patient Active Problem List   Diagnosis Date Noted  . Status post total hip replacement, right 06/04/2019  . Hamartoma of lung ([place]) 07/25/2015  . Asthma without status asthmaticus 03/09/2014  . HLD (hyperlipidemia) 03/09/2014  . BP (high blood pressure) 03/09/2014  . Adult hypothyroidism 03/09/2014    Past Medical History:  Diagnosis Date  . Arthritis   . Asthma   . Bell's palsy   . Cancer ([place])    thyroid  . Hyperlipemia   . Hypertension   . Hypothyroidism   . Stroke ([place])    DATE UNKNOWN  . Thyroid disease      Transfusion: None   Consultants (if any): Treatment Team:  Vladimir Crofts, MD  Discharged Condition: Improved  Hospital Course: Brenda Mckinney is an 59 y.o. female who was admitted 06/04/2019 with a diagnosis of right hip osteoarthritis and went to the operating room on 06/04/2019 and underwent the above named procedures.    Surgeries: Procedure(s): RIGHT TOTAL HIP ARTHROPLASTY ANTERIOR APPROACH on 06/04/2019 Patient tolerated the surgery well. Taken to PACU where she was stabilized and then transferred to the orthopedic floor.  Started on Lovenox 40 mg q 24 hrs. Foot pumps applied bilaterally at 80 mm. Heels elevated on bed with rolled towels. No evidence of DVT. Negative Homan. Physical therapy started on day #1 for gait training and transfer. OT started day #1 for ADL and assisted devices.  Patient's foley was d/c on day #1. Patient's IV was d/c on day #2.  On post op day #3 patient was stable and ready for discharge to home with home health PT.  Implants:  Medacta AMIS 1 standard stem, 50 mm Mpact TM cup and liner with ceramic M 28 mm head  She was given perioperative antibiotics:   Anti-infectives (From admission, onward)   Start     Dose/Rate Route Frequency Ordered Stop   06/04/19 1600  ceFAZolin (ANCEF) IVPB 2g/100 mL premix     2 g 200 mL/hr over 30 Minutes Intravenous Every 6 hours 06/04/19 1155 06/05/19 1035   06/04/19 0830  ceFAZolin (ANCEF) IVPB 2g/100 mL premix     2 g 200 mL/hr over 30 Minutes Intravenous On call to O.R. 06/04/19 0825 06/04/19 1015   06/04/19 0827  ceFAZolin (ANCEF) 2-4 GM/100ML-% IVPB    Note to Pharmacy: Sylvester Harder   : cabinet override      06/04/19 0827 06/04/19 1034    .  She was given sequential compression devices, early ambulation, and Lovenox, teds for DVT prophylaxis.  She benefited maximally from the hospital stay and there were no complications.    Recent vital signs:  Vitals:   06/07/19 0018 06/07/19 0817  BP: 129/66 116/69  Pulse: 68 70  Resp: 16 16  Temp: 98.3 F (36.8 C) 98.6 F (37 C)  SpO2: 97% 98%    Recent laboratory studies:  Lab Results  Component Value Date   HGB 9.2 (L) 06/06/2019   HGB 9.6 (L) 06/05/2019   HGB 11.1 (L) 06/04/2019   Lab Results  Component Value Date   WBC 9.2 06/06/2019   PLT 252 06/06/2019   Lab Results  Component Value Date   INR 1.14 07/18/2015   Lab Results  Component Value Date   NA 136 06/05/2019  K 3.5 06/05/2019   CL 102 06/05/2019   CO2 26 06/05/2019   BUN 11 06/05/2019   CREATININE 0.76 06/05/2019   GLUCOSE 139 (H) 06/05/2019    Discharge Medications:   Allergies as of 06/07/2019   No Known Allergies     Medication List    TAKE these medications   Albuterol Sulfate 108 (90 Base) MCG/ACT Aepb Commonly known as: PROAIR RESPICLICK Inhale 2 puffs into the lungs every 4 (four) hours as needed (shortness of breath or wheezing).   amLODipine 5 MG tablet Commonly known as: NORVASC Take 5 mg by mouth daily.   ASPIRIN 81 PO Take 1 tablet by mouth.   docusate sodium 100 MG capsule Commonly known as: COLACE Take 1 capsule (100 mg total) by mouth 2  (two) times daily.   enoxaparin 40 MG/0.4ML injection Commonly known as: LOVENOX Inject 0.4 mLs (40 mg total) into the skin daily for 14 days.   fluticasone 50 MCG/ACT nasal spray Commonly known as: FLONASE Place 2 sprays into both nostrils daily.   levothyroxine 112 MCG tablet Commonly known as: SYNTHROID Take 112 mcg by mouth daily before breakfast.   lisinopril-hydrochlorothiazide 20-25 MG tablet Commonly known as: ZESTORETIC Take 1 tablet by mouth daily.   methocarbamol 500 MG tablet Commonly known as: ROBAXIN Take 1 tablet (500 mg total) by mouth every 6 (six) hours as needed for muscle spasms.   metoprolol tartrate 25 MG tablet Commonly known as: LOPRESSOR Take 25 mg by mouth 2 (two) times daily.   oxyCODONE 5 MG immediate release tablet Commonly known as: Oxy IR/ROXICODONE Take 1-2 tablets (5-10 mg total) by mouth every 4 (four) hours as needed for moderate pain (pain score 4-6).   predniSONE 5 MG tablet Commonly known as: DELTASONE Take 5 mg by mouth daily with breakfast.   simvastatin 40 MG tablet Commonly known as: ZOCOR Take 40 mg by mouth daily.   traMADol 50 MG tablet Commonly known as: ULTRAM Take 1 tablet (50 mg total) by mouth every 6 (six) hours as needed.            Durable Medical Equipment  (From admission, onward)         Start     Ordered   06/04/19 1156  DME Walker rolling  Once    Question Answer Comment  Walker: With 5 Inch Wheels   Patient needs a walker to treat with the following condition Status post total hip replacement, right      06/04/19 1155   06/04/19 1156  DME 3 n 1  Once     06/04/19 1155   06/04/19 1156  DME Bedside commode  Once    Question:  Patient needs a bedside commode to treat with the following condition  Answer:  Status post total hip replacement, right   06/04/19 1155          Diagnostic Studies: DG HIP OPERATIVE UNILAT W OR W/O PELVIS RIGHT  Result Date: 06/04/2019 CLINICAL DATA:  Status post right  total hip arthroplasty. EXAM: OPERATIVE right HIP (WITH PELVIS IF PERFORMED) 1 VIEWS TECHNIQUE: Fluoroscopic spot image(s) were submitted for interpretation post-operatively. Radiation exposure index: 10.3 mGy. COMPARISON:  None. FINDINGS: Single intraoperative fluoroscopic image of the right hip demonstrates the acetabular and femoral components to be well situated. IMPRESSION: Status post right total hip arthroplasty. Electronically Signed   By: Marijo Conception M.D.   On: 06/04/2019 11:48   DG HIP UNILAT W OR W/O PELVIS 2-3 VIEWS RIGHT  Result Date: 06/04/2019 CLINICAL DATA:  Patient status post right hip replacement today. EXAM: DG HIP (WITH OR WITHOUT PELVIS) 2-3V RIGHT COMPARISON:  Intraoperative imaging earlier today. FINDINGS: Total hip arthroplasty is in place. The device is located. No fracture. Surgical drain and staples are noted. IMPRESSION: Status post right hip replacement.  No acute finding. Electronically Signed   By: Inge Rise M.D.   On: 06/04/2019 12:32    Disposition:     Follow-up Information    Duanne Guess, PA-C Follow up in 2 week(s).   Specialties: Orthopedic Surgery, Emergency Medicine Contact information: [place] [address] [phone]            Signed: Feliberto Gottron 06/07/2019, 9:13 AM

## 2019-06-07 NOTE — Plan of Care (Signed)
Patient had BM after order obtained from Dr. Rudene Christians for mag citrate and informed of BP.  Patient with no s/s of distress noted.  Wound vac changed to portable.  Discharge instructions with prescriptions given to spouse.  Patient given information for [facility] that closes at 8pm to fill scripts.  Understanding verbalized.  Assisted to car via staff. Belongings with patient.

## 2019-06-08 LAB — SURGICAL PATHOLOGY

## 2019-09-10 HISTORY — PX: COLONOSCOPY: SHX174

## 2020-05-03 ENCOUNTER — Other Ambulatory Visit: Payer: Self-pay | Admitting: Internal Medicine

## 2020-05-04 ENCOUNTER — Other Ambulatory Visit: Payer: Self-pay | Admitting: Internal Medicine

## 2020-05-04 DIAGNOSIS — Z1231 Encounter for screening mammogram for malignant neoplasm of breast: Secondary | ICD-10-CM

## 2020-05-18 ENCOUNTER — Other Ambulatory Visit: Payer: Self-pay

## 2020-05-18 ENCOUNTER — Ambulatory Visit
Admission: RE | Admit: 2020-05-18 | Discharge: 2020-05-18 | Disposition: A | Discharge status: Home / Self Care | Payer: BC Managed Care – PPO | Source: Ambulatory Visit | Attending: Internal Medicine | Admitting: Internal Medicine

## 2020-05-18 DIAGNOSIS — Z1231 Encounter for screening mammogram for malignant neoplasm of breast: Secondary | ICD-10-CM | POA: Diagnosis not present

## 2020-08-23 ENCOUNTER — Emergency Department
Admission: EM | Admit: 2020-08-23 | Discharge: 2020-08-23 | Disposition: A | Discharge status: Home / Self Care | Payer: BC Managed Care – PPO | Attending: Emergency Medicine | Admitting: Emergency Medicine

## 2020-08-23 ENCOUNTER — Emergency Department: Payer: BC Managed Care – PPO

## 2020-08-23 ENCOUNTER — Other Ambulatory Visit: Payer: Self-pay

## 2020-08-23 ENCOUNTER — Encounter: Payer: Self-pay | Admitting: Intensive Care

## 2020-08-23 DIAGNOSIS — M79604 Pain in right leg: Secondary | ICD-10-CM | POA: Diagnosis not present

## 2020-08-23 DIAGNOSIS — M545 Low back pain, unspecified: Secondary | ICD-10-CM | POA: Diagnosis present

## 2020-08-23 DIAGNOSIS — Z7951 Long term (current) use of inhaled steroids: Secondary | ICD-10-CM | POA: Insufficient documentation

## 2020-08-23 DIAGNOSIS — Z85850 Personal history of malignant neoplasm of thyroid: Secondary | ICD-10-CM | POA: Insufficient documentation

## 2020-08-23 DIAGNOSIS — Z96641 Presence of right artificial hip joint: Secondary | ICD-10-CM | POA: Insufficient documentation

## 2020-08-23 DIAGNOSIS — M5416 Radiculopathy, lumbar region: Secondary | ICD-10-CM | POA: Diagnosis not present

## 2020-08-23 DIAGNOSIS — I10 Essential (primary) hypertension: Secondary | ICD-10-CM | POA: Diagnosis not present

## 2020-08-23 DIAGNOSIS — E039 Hypothyroidism, unspecified: Secondary | ICD-10-CM | POA: Diagnosis not present

## 2020-08-23 DIAGNOSIS — Z79899 Other long term (current) drug therapy: Secondary | ICD-10-CM | POA: Insufficient documentation

## 2020-08-23 DIAGNOSIS — J45909 Unspecified asthma, uncomplicated: Secondary | ICD-10-CM | POA: Diagnosis not present

## 2020-08-23 DIAGNOSIS — Z7982 Long term (current) use of aspirin: Secondary | ICD-10-CM | POA: Diagnosis not present

## 2020-08-23 MED ORDER — KETOROLAC TROMETHAMINE 30 MG/ML IJ SOLN
30.0000 mg | Freq: Once | INTRAMUSCULAR | Status: AC
  Administered 2020-08-23: 30 mg via INTRAMUSCULAR
  Filled 2020-08-23: qty 1

## 2020-08-23 MED ORDER — ORPHENADRINE CITRATE 30 MG/ML IJ SOLN
60.0000 mg | Freq: Once | INTRAMUSCULAR | Status: AC
  Administered 2020-08-23: 60 mg via INTRAMUSCULAR
  Filled 2020-08-23: qty 2

## 2020-08-23 MED ORDER — METHOCARBAMOL 500 MG PO TABS: 500.0000 mg | ORAL_TABLET | Freq: Four times a day (QID) | ORAL | 0 refills | Status: DC

## 2020-08-23 MED ORDER — MELOXICAM 15 MG PO TABS: 15.0000 mg | ORAL_TABLET | Freq: Every day | ORAL | 0 refills | Status: DC

## 2020-08-23 NOTE — ED Triage Notes (Signed)
Patient reports bilateral leg pain that started Sunday. Today experiencing only right leg pain and swelling, worsening pain with ambulation

## 2020-08-23 NOTE — ED Provider Notes (Signed)
[facility] Emergency Department Provider Note  ____________________________________________  Time seen: Approximately 8:04 PM  I have reviewed the triage vital signs and the nursing notes.   HISTORY  Chief Complaint Leg Pain    HPI Brenda Mckinney is a 60 y.o. female who presents to the emergency department complaining of right leg pain.  Patient states that she had been sitting for a while doing home at work when she got up she had developed bilateral leg pain from the knee down.  Patient states that the left leg pain had resolved but the right leg pain has persisted.  She feels like it will sometimes try to get out from underneath her.  She has not sustained any falls when this happened.  She is endorsing primarily pain behind the right knee.  No numbness or tingling of the foot.  She has had some intermittent issues with her back but states that her back is not bothering her more than normal.  No bowel bladder dysfunction, saddle anesthesia or paresthesias.  Patient denies any skin changes to the right leg.  No erythema or edema.  No history of DVT/PE.       Past Medical History:  Diagnosis Date   Arthritis    Asthma    Bell's palsy    Cancer ([place])    thyroid   Hyperlipemia    Hypertension    Hypothyroidism    Stroke [facility])    DATE UNKNOWN   Thyroid disease     Patient Active Problem List   Diagnosis Date Noted   Status post total hip replacement, right 06/04/2019   Hamartoma of lung ([place]) 07/25/2015   Asthma without status asthmaticus 03/09/2014   HLD (hyperlipidemia) 03/09/2014   BP (high blood pressure) 03/09/2014   Adult hypothyroidism 03/09/2014    Past Surgical History:  Procedure Laterality Date   ABDOMINAL HYSTERECTOMY     COLONOSCOPY     EYE SURGERY Bilateral    THYROIDECTOMY     TOTAL HIP ARTHROPLASTY Right 06/04/2019   Procedure: RIGHT TOTAL HIP ARTHROPLASTY ANTERIOR APPROACH;  Surgeon: Hessie Knows, MD;  Location: [facility]  ORS;  Service: Orthopedics;  Laterality: Right;   TUBAL LIGATION     VIDEO ASSISTED THORACOSCOPY (VATS)/THOROCOTOMY Right 07/25/2015   Procedure: RIGHT THOROCOSCOPY REMOVAL RIGHT LOWER LOBE MASS, PREOP BRONCHOSCOPY;  Surgeon: Nestor Lewandowsky, MD;  Location: [facility] ORS;  Service: General;  Laterality: Right;    Prior to Admission medications   Medication Sig Start Date End Date Taking? Authorizing Provider  Albuterol Sulfate 108 (90 Base) MCG/ACT AEPB Inhale 2 puffs into the lungs every 4 (four) hours as needed (shortness of breath or wheezing).  09/06/14   [provider]  amLODipine (NORVASC) 5 MG tablet Take 5 mg by mouth daily.  09/06/14 05/26/19  [provider]  ASPIRIN 81 PO Take 1 tablet by mouth.    [provider]  docusate sodium (COLACE) 100 MG capsule Take 1 capsule (100 mg total) by mouth 2 (two) times daily. 06/07/19   Duanne Guess, PA-C  enoxaparin (LOVENOX) 40 MG/0.4ML injection Inject 0.4 mLs (40 mg total) into the skin daily for 14 days. 06/07/19 06/21/19  Duanne Guess, PA-C  fluticasone (FLONASE) 50 MCG/ACT nasal spray Place 2 sprays into both nostrils daily.    [provider]  levothyroxine (SYNTHROID, LEVOTHROID) 112 MCG tablet Take 112 mcg by mouth daily before breakfast.  03/28/15   [provider]  lisinopril-hydrochlorothiazide (PRINZIDE,ZESTORETIC) 20-25 MG tablet Take 1 tablet  by mouth daily.  12/06/14   [provider]  methocarbamol (ROBAXIN) 500 MG tablet Take 1 tablet (500 mg total) by mouth every 6 (six) hours as needed for muscle spasms. 06/07/19   Duanne Guess, PA-C  metoprolol tartrate (LOPRESSOR) 25 MG tablet Take 25 mg by mouth 2 (two) times daily.  11/12/14   [provider]  oxyCODONE (OXY IR/ROXICODONE) 5 MG immediate release tablet Take 1-2 tablets (5-10 mg total) by mouth every 4 (four) hours as needed for moderate pain (pain score 4-6). 06/07/19   Duanne Guess, PA-C  predniSONE (DELTASONE) 5 MG  tablet Take 5 mg by mouth daily with breakfast.    [provider]  simvastatin (ZOCOR) 40 MG tablet Take 40 mg by mouth daily.  11/12/14   [provider]  traMADol (ULTRAM) 50 MG tablet Take 1 tablet (50 mg total) by mouth every 6 (six) hours as needed. 06/07/19   Duanne Guess, PA-C    Allergies Patient has no known allergies.  Family History  Problem Relation Age of Onset   Breast cancer Paternal Aunt    Lymphoma Sister     Social History Social History   Tobacco Use   Smoking status: Never   Smokeless tobacco: Never  Vaping Use   Vaping Use: Never used  Substance Use Topics   Alcohol use: Yes    Comment: rare   Drug use: No     Review of Systems  Constitutional: No fever/chills Eyes: No visual changes. No discharge ENT: No upper respiratory complaints. Cardiovascular: no chest pain. Respiratory: no cough. No SOB. Gastrointestinal: No abdominal pain.  No nausea, no vomiting.  No diarrhea.  No constipation. Musculoskeletal: Positive for right leg pain concentrated primarily behind the right knee Skin: Negative for rash, abrasions, lacerations, ecchymosis. Neurological: Negative for headaches, focal weakness or numbness.  10 System ROS otherwise negative.  ____________________________________________   PHYSICAL EXAM:  VITAL SIGNS: ED Triage Vitals  Enc Vitals Group     BP 08/23/20 1940 (!) 150/89     Pulse Rate 08/23/20 1940 (!) 52     Resp 08/23/20 1940 16     Temp 08/23/20 1940 98.6 F (37 C)     Temp Source 08/23/20 1940 Oral     SpO2 08/23/20 1940 98 %     Weight 08/23/20 1809 230 lb (104.3 kg)     Height 08/23/20 1809 '5\' 5"'$  (1.651 m)     Head Circumference --      Peak Flow --      Pain Score 08/23/20 1809 6     Pain Loc --      Pain Edu? --      Excl. in [place]? --      Constitutional: Alert and oriented. Well appearing and in no acute distress. Eyes: Conjunctivae are normal. PERRL. EOMI. Head: Atraumatic. ENT:      Ears:        Nose: No congestion/rhinnorhea.      Mouth/Throat: Mucous membranes are moist.  Neck: No stridor.    Cardiovascular: Normal rate, regular rhythm. Normal S1 and S2.  Good peripheral circulation. Respiratory: Normal respiratory effort without tachypnea or retractions. Lungs CTAB. Good air entry to the bases with no decreased or absent breath sounds. Musculoskeletal: Full range of motion to all extremities. No gross deformities appreciated.  Examination of the right leg reveals no erythema, edema, ecchymosis.  No warmth to palpation.  No coolness to palpation.  Dorsalis pedis pulses sensation intact.  Palpation reveals some mild tenderness in the right popliteal fossa without any other palpable tenderness.  Full range of motion all the joints.  Examination of the lumbar spine reveals no tenderness over the lumbar spine, no tenderness over the SI joint but some tenderness over the right-sided sciatic notch. Neurologic:  Normal speech and language. No gross focal neurologic deficits are appreciated.  Skin:  Skin is warm, dry and intact. No rash noted. Psychiatric: Mood and affect are normal. Speech and behavior are normal. Patient exhibits appropriate insight and judgement.   ____________________________________________   LABS (all labs ordered are listed, but only abnormal results are displayed)  Labs Reviewed - No data to display ____________________________________________  EKG   ____________________________________________  RADIOLOGY I personally viewed and evaluated these images as part of my medical decision making, as well as reviewing the written report by the radiologist.  ED Provider Interpretation: No findings consistent with DVT on ultrasound  US Venous Img Lower Unilateral Right  Result Date: 08/23/2020 CLINICAL DATA:  Swelling/pain. EXAM: RIGHT LOWER EXTREMITY VENOUS DOPPLER ULTRASOUND TECHNIQUE: Gray-scale sonography with compression, as well as color and duplex  ultrasound, were performed to evaluate the deep venous system(s) from the level of the common femoral vein through the popliteal and proximal calf veins. COMPARISON:  None. FINDINGS: VENOUS Normal compressibility of the common femoral, superficial femoral, and popliteal veins, as well as the visualized calf veins. Visualized portions of profunda femoral vein and great saphenous vein unremarkable. No filling defects to suggest DVT on grayscale or color Doppler imaging. Doppler waveforms show normal direction of venous flow, normal respiratory plasticity and response to augmentation. Limited views of the contralateral common femoral vein are unremarkable. IMPRESSION: No evidence of DVT in the right lower extremity. Electronically Signed   By: Margaretha Sheffield MD   On: 08/23/2020 19:28    ____________________________________________    PROCEDURES  Procedure(s) performed:    Procedures    Medications  ketorolac (TORADOL) 30 MG/ML injection 30 mg (has no administration in time range)  orphenadrine (NORFLEX) injection 60 mg (has no administration in time range)     ____________________________________________   INITIAL IMPRESSION / ASSESSMENT AND PLAN / ED COURSE  Pertinent labs & imaging results that were available during my care of the patient were reviewed by me and considered in my medical decision making (see chart for details).  Review of the [place] CSRS was performed in accordance of the [place] prior to dispensing any controlled drugs.           Patient's diagnosis is consistent with lumbar radiculopathy.  Patient presented to the emergency department with pain to the right leg.  She been sitting for prolonged period of time, stood up and had pain to both legs.  Left leg has resolved but right leg has persisted. No skin changes concerning for cellulitis.  Patient had some tenderness over the right side sciatic notch she does have a history of intermittent back issues.  No increased  pain greater than normal to her back.  No concerning neuro deficits with loss of sensation, saddle anesthesia, paresthesias or bowel or bladder dysfunction.  Ultrasound was negative for DVT.  Given the findings I suspect lumbar radiculopathy.  Differential included cellulitis, DVT, lumbar radiculopathy, Baker's cyst.  Patient will be placed on anti-inflammatory muscle relaxer.  Follow-up primary care as needed.     ____________________________________________  FINAL CLINICAL IMPRESSION(S) / ED DIAGNOSES  Final diagnoses:  Lumbar radiculopathy      NEW MEDICATIONS STARTED DURING THIS  VISIT:  ED Discharge Orders     None           This chart was dictated using voice recognition software/Dragon. Despite best efforts to proofread, errors can occur which can change the meaning. Any change was purely unintentional.    Darletta Moll, PA-C 08/23/20 2019    Nance Pear, MD 08/23/20 2100

## 2021-03-27 ENCOUNTER — Other Ambulatory Visit: Payer: Self-pay | Admitting: Internal Medicine

## 2021-03-27 DIAGNOSIS — Z1231 Encounter for screening mammogram for malignant neoplasm of breast: Secondary | ICD-10-CM

## 2021-05-22 ENCOUNTER — Ambulatory Visit
Admission: RE | Admit: 2021-05-22 | Discharge: 2021-05-22 | Disposition: A | Discharge status: Home / Self Care | Payer: BC Managed Care – PPO | Source: Ambulatory Visit | Attending: Internal Medicine | Admitting: Internal Medicine

## 2021-05-22 DIAGNOSIS — Z1231 Encounter for screening mammogram for malignant neoplasm of breast: Secondary | ICD-10-CM | POA: Insufficient documentation

## 2022-03-07 ENCOUNTER — Emergency Department
Admission: EM | Admit: 2022-03-07 | Discharge: 2022-03-07 | Disposition: A | Discharge status: Home / Self Care | Payer: BC Managed Care – PPO | Attending: Emergency Medicine | Admitting: Emergency Medicine

## 2022-03-07 ENCOUNTER — Emergency Department: Payer: BC Managed Care – PPO

## 2022-03-07 ENCOUNTER — Other Ambulatory Visit: Payer: Self-pay

## 2022-03-07 DIAGNOSIS — I10 Essential (primary) hypertension: Secondary | ICD-10-CM | POA: Insufficient documentation

## 2022-03-07 DIAGNOSIS — E039 Hypothyroidism, unspecified: Secondary | ICD-10-CM | POA: Diagnosis not present

## 2022-03-07 DIAGNOSIS — Z8673 Personal history of transient ischemic attack (TIA), and cerebral infarction without residual deficits: Secondary | ICD-10-CM | POA: Diagnosis not present

## 2022-03-07 DIAGNOSIS — J45909 Unspecified asthma, uncomplicated: Secondary | ICD-10-CM | POA: Insufficient documentation

## 2022-03-07 DIAGNOSIS — M542 Cervicalgia: Secondary | ICD-10-CM | POA: Diagnosis present

## 2022-03-07 DIAGNOSIS — M5000 Cervical disc disorder with myelopathy, unspecified cervical region: Secondary | ICD-10-CM | POA: Diagnosis not present

## 2022-03-07 DIAGNOSIS — Z85850 Personal history of malignant neoplasm of thyroid: Secondary | ICD-10-CM | POA: Diagnosis not present

## 2022-03-07 DIAGNOSIS — G959 Disease of spinal cord, unspecified: Secondary | ICD-10-CM

## 2022-03-07 LAB — COMPREHENSIVE METABOLIC PANEL
ALT: 20 U/L (ref 0–44)
AST: 17 U/L (ref 15–41)
Albumin: 4.2 g/dL (ref 3.5–5.0)
Alkaline Phosphatase: 85 U/L (ref 38–126)
Anion gap: 11 (ref 5–15)
BUN: 20 mg/dL (ref 8–23)
CO2: 26 mmol/L (ref 22–32)
Calcium: 8.8 mg/dL — ABNORMAL LOW (ref 8.9–10.3)
Chloride: 102 mmol/L (ref 98–111)
Creatinine, Ser: 0.81 mg/dL (ref 0.44–1.00)
GFR, Estimated: >60 mL/min (ref >60)
Glucose, Bld: 114 mg/dL — ABNORMAL HIGH (ref 70–99)
Potassium: 4.3 mmol/L (ref 3.5–5.1)
Sodium: 139 mmol/L (ref 135–145)
Total Bilirubin: 0.7 mg/dL (ref 0.3–1.2)
Total Protein: 7.1 g/dL (ref 6.5–8.1)

## 2022-03-07 LAB — CBC
HCT: 42 % (ref 36.0–46.0)
Hemoglobin: 13.6 g/dL (ref 12.0–15.0)
MCH: 28.2 pg (ref 26.0–34.0)
MCHC: 32.4 g/dL (ref 30.0–36.0)
MCV: 87 fL (ref 80.0–100.0)
Platelets: 323 10*3/uL (ref 150–400)
RBC: 4.83 MIL/uL (ref 3.87–5.11)
RDW: 14.6 % (ref 11.5–15.5)
WBC: 8.8 10*3/uL (ref 4.0–10.5)
nRBC: 0 % (ref 0.0–0.2)

## 2022-03-07 LAB — TROPONIN I (HIGH SENSITIVITY)
Troponin I (High Sensitivity): 29 ng/L — ABNORMAL HIGH (ref <18)
Troponin I (High Sensitivity): 29 ng/L — ABNORMAL HIGH (ref <18)

## 2022-03-07 NOTE — ED Notes (Signed)
First Nurse Note: Patient sent from [facility] for left sided weakness. States symptoms started at 0600. Ambulatory to triage- refusing wheelchair. Hx of stroke and bells palsy.

## 2022-03-07 NOTE — Discharge Instructions (Signed)
Follow-up with Dr. Jonathon Jordan office.  If you do not hear from the office in 7 days please call the number above to schedule appointment.  Continue to take Tylenol and Motrin for your pain.

## 2022-03-07 NOTE — ED Provider Notes (Signed)
[facility] Provider Note    Event Date/Time   First MD Initiated Contact with Patient 03/07/22 1502     (approximate)   History   Arm Pain and Weakness   HPI  Brenda Mckinney is a 61 y.o. female  with pmh asthma, HTN, HLD who p/w left hand weakness.  Patient tells me that she has had pain originating near the neck/shoulder region radiating around to her hand for many months.  However does not typically have weakness with this.  This morning she woke up and then around 6 AM felt like she was not able to use her hand properly.  She denies feeling weakness in the rest of the arm.  Denies frank numbness in that arm.  Denies any new vision symptoms or difficulty speaking.  She did intermittently feeling the left leg was may be slightly weak and was having difficulty walking on it.  Denies back pain.  Denies any neck pain.  She denies chest pain or difficulty breathing but did feel somewhat lightheaded.  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      Excl. in [place]?     Most recent vital signs: Vitals:   03/07/22 1700 03/07/22 1815  BP: (!) 166/78   Pulse: 67 (!) 46  Resp: 19   Temp:    SpO2: 95% 98%     General: Awake, no distress.  CV:  Good peripheral perfusion.  Resp:  Normal effort.  Abd:  No distention.  Neuro:             Awake, Alert, Oriented x 3  Other:  Patient has slight left-sided pronator drift 4/5 elbow flexion and extension on the left compared to 5 out of 5 on the right 3-5 strength with grip on the left compared to 5 out of 5 on the right 2 out of 5 finger abduction left, 5 out of 5 on the right Decreased strength with thumbs up on the left compared to normal on the right  Okay sign equal strength bilaterally Very slightly diminished hip flexor strength on the L when tested with resistance but able to hold both legs off the bed for 5 seconds Sensation grossly intact in median radial and ulnar distributions bilateral upper extremities Sensation gross intact in bilateral lower extremities Subtle left-sided facial weakness corrects with smile consistent with prior Bell's palsy PERRLA, EOMI, nml tongue movement    ED Results / Procedures / Treatments  Labs (all labs ordered are listed, but only abnormal results are displayed) Labs Reviewed  COMPREHENSIVE METABOLIC PANEL - Abnormal; Notable for the following components:      Result Value   Glucose, Bld 114 (*)    Calcium 8.8 (*)    All other components within normal limits  TROPONIN I (HIGH SENSITIVITY) - Abnormal; Notable for the following components:   Troponin I (High Sensitivity) 29 (*)    All other components within normal limits  TROPONIN I (HIGH SENSITIVITY) - Abnormal; Notable for the following components:   Troponin I (High Sensitivity) 29 (*)    All other components within normal limits  CBC     EKG  EKG reviewed interpreted myself  shows sinus bradycardia normal axis normal intervals biphasic T waves lead III aVF  RADIOLOGY I reviewed and interpreted MRI of the brain which does not show any acute infarct   PROCEDURES:  Critical Care performed: No  Procedures  The patient is on the cardiac monitor to evaluate for evidence of arrhythmia and/or significant heart rate changes.   MEDICATIONS ORDERED IN ED: Medications - No data to display   IMPRESSION / MDM / [place] / ED COURSE  I reviewed the triage vital signs and the nursing notes.                              Patient's presentation is most consistent with acute complicated illness / injury requiring diagnostic workup.  Differential diagnosis includes, but is not limited to, cervical radiculopathy, brachial plexopathy, median or ulnar neuropathy, CVA  The patient is a 62 year old female with prior old CVA seen on MRI who presents primarily with left hand weakness.  She noticed this this morning.  She previously has had what sounds like a cervical radiculopathy type pain radiating from the top of her neck down to her hand for several months but not typically been associated with weakness.  She has no real neck pain associated with it.  Feels like the left leg is slightly weak as well but tells me that this has been intermittent in the past.  On exam she has very slight pronator drift with grip strength on the left is significantly diminished compared to the right she has decree strength with abduction and slight decrease strength with elbow flexion extension.  Sensory exam is overall reassuring.  Also has very subtle decrease strength in the left lower extremity compared to the right although she is able to hold both legs off the bed for greater than 5 seconds without difficulty.  My suspicion is that this is probably a peripheral nerve issue likely cervical radiculopathy in the C8/T1 distribution, with her leg symptoms it is difficult to rule out CVA.   Will obtain MRI of the brain and C-spine to further evaluate.  Troponin which was sent from triage which is mildly elevated at 29.  She is not having any chest symptoms dyspnea or other symptoms of angina.  She is chronically hypotensive so  I suspect that this is demand related but she will need a repeat.  Repeat troponin is flat at 29.  MRI of the brain does not suggest acute ischemic process.  MRI of the cervical spine is essentially consistent with a chronic cervical myelopathy.  I did discuss with Dr. Lacinda Axon with neurosurgery to ensure that patient is appropriate for outpatient follow-up.  He did recommend she follow-up with them but is okay with her being discharged.  Discussed findings with the patient.  Encouraged her to reach out to neurosurgery if she does not hear from the office in several days.       FINAL CLINICAL IMPRESSION(S) / ED DIAGNOSES   Final diagnoses:  Cervical myelopathy ([place])     Rx / DC Orders   ED Discharge Orders     None        Note:  This document was prepared using Dragon voice recognition software and may include unintentional dictation errors.   Rada Hay, MD 03/07/22 [phone]

## 2022-03-07 NOTE — ED Triage Notes (Signed)
Pt states that she woke up around 0600 and after she started working she noticed that her left hand wasn't working like it normally should, pt states yesterday and last night it was working correctly, pt has  a hx of bell's palsy and has had some type of stroke in the past that she was unaware of at that time. Pt states that her left side is in pain and points to her left leg. Pt states that she has had a pinched nerve sensation in her left arm and thumb for months. Pt states that she had an xray on the 3rd of january

## 2022-03-08 ENCOUNTER — Telehealth: Payer: Self-pay

## 2022-03-08 NOTE — Telephone Encounter (Signed)
The ER called Dr Lacinda Axon about this patient 03/07/22. Per Dr Lacinda Axon, "some grip weakness but otherwise normal. MRI with chronic stenosis, likely early myelopathy. She was good for clinic follow up, might be better sooner as sounds operative"  Please schedule a new patient appt with Dr Izora Ribas ASAP. Thanks

## 2022-03-08 NOTE — Telephone Encounter (Signed)
Left message to call back  

## 2022-03-09 NOTE — Telephone Encounter (Signed)
03/27/2022 his next opening and added to the wait list.

## 2022-03-26 NOTE — Progress Notes (Unsigned)
Referring Physician:  Baxter Hire, MD [place],  [place] [address]  Primary Physician:  Baxter Hire, MD  History of Present Illness: 03/27/2022 Brenda Mckinney is here today with a chief complaint of difficulty with use of her left arm as well as pain into her left arm.  She has left grip weakness and trouble opening jars and doing other things.  Her left hand feels heavy.  She has lost some dexterity in her left hand.  She has been dropping items as well.  She reports that she has had trouble with her balance.  Her husband reports that she sometimes veers off to the side when walking as though she is unsure of her balance.  This has been ongoing for a few weeks.   She recently went to the emergency department due to worsening of her symptoms.  She obtained imaging at that time. Bowel/Bladder Dysfunction: none  Conservative measures:  Physical therapy:  has not participated in Multimodal medical therapy including regular antiinflammatories: tylenol, ibuprofen  Injections:  has not received epidural steroid injections  Past Surgery: denies  Brenda Mckinney has symptoms of cervical myelopathy.  The symptoms are causing a significant impact on the patient's life.   I have utilized the care everywhere function in epic to review the outside records available from external health systems.  Review of Systems:  A 10 point review of systems is negative, except for the pertinent positives and negatives detailed in the HPI.  Past Medical History: Past Medical History:  Diagnosis Date   Arthritis    Asthma    Bell's palsy    Cancer ([place])    thyroid   Hyperlipemia    Hypertension    Hypothyroidism    Stroke ([place])    DATE UNKNOWN   Thyroid disease     Past Surgical History: Past Surgical History:  Procedure Laterality Date   ABDOMINAL HYSTERECTOMY     COLONOSCOPY     EYE SURGERY Bilateral    THYROIDECTOMY     TOTAL HIP  ARTHROPLASTY Right 06/04/2019   Procedure: RIGHT TOTAL HIP ARTHROPLASTY ANTERIOR APPROACH;  Surgeon: Hessie Knows, MD;  Location: [facility] ORS;  Service: Orthopedics;  Laterality: Right;   TUBAL LIGATION     VIDEO ASSISTED THORACOSCOPY (VATS)/THOROCOTOMY Right 07/25/2015   Procedure: RIGHT THOROCOSCOPY REMOVAL RIGHT LOWER LOBE MASS, PREOP BRONCHOSCOPY;  Surgeon: Nestor Lewandowsky, MD;  Location: [facility] ORS;  Service: General;  Laterality: Right;    Allergies: Allergies as of 03/27/2022   (No Known Allergies)    Medications: Current Meds  Medication Sig   albuterol (PROVENTIL) (2.5 MG/3ML) 0.083% nebulizer solution Take 2.5 mg by nebulization every 6 (six) hours as needed for wheezing or shortness of breath.   Albuterol Sulfate 108 (90 Base) MCG/ACT AEPB Inhale 2 puffs into the lungs every 4 (four) hours as needed (shortness of breath or wheezing).    amLODipine (NORVASC) 5 MG tablet Take 5 mg by mouth daily.    aspirin EC 81 MG tablet Take 81 mg by mouth daily.   fluticasone (FLONASE) 50 MCG/ACT nasal spray Place 2 sprays into both nostrils daily.   hydrochlorothiazide (HYDRODIURIL) 25 MG tablet Take 25 mg by mouth daily.   levothyroxine (SYNTHROID, LEVOTHROID) 112 MCG tablet Take 112 mcg by mouth daily before breakfast.    lisinopril (ZESTRIL) 40 MG tablet Take 40 mg by mouth daily.   metoprolol tartrate (LOPRESSOR) 25 MG tablet Take 25 mg by mouth 2 (two) times  daily.    montelukast (SINGULAIR) 10 MG tablet Take 10 mg by mouth at bedtime.   potassium chloride (KLOR-CON) 10 MEQ tablet Take 10 mEq by mouth daily.   predniSONE (DELTASONE) 5 MG tablet Take 5 mg by mouth daily with breakfast.   simvastatin (ZOCOR) 40 MG tablet Take 40 mg by mouth daily.     Social History: Social History   Tobacco Use   Smoking status: Never   Smokeless tobacco: Never  Vaping Use   Vaping Use: Never used  Substance Use Topics   Alcohol use: Yes    Comment: rare   Drug use: No    Family Medical  History: Family History  Problem Relation Age of Onset   Breast cancer Paternal Aunt    Lymphoma Sister     Physical Examination: Vitals:   03/27/22 1315  BP: (!) 150/86  Pulse: (!) 50  SpO2: 97%    General: Patient is well developed, well nourished, calm, collected, and in no apparent distress. Attention to examination is appropriate.  Neck:   Supple.  Full range of motion.  Respiratory: Patient is breathing without any difficulty.   NEUROLOGICAL:     Awake, alert, oriented to person, place, and time.  Speech is clear and fluent.   Cranial Nerves: Pupils equal round and reactive to light.  Facial tone is symmetric.  Facial sensation is symmetric. Shoulder shrug is symmetric. Tongue protrusion is midline.  There is no pronator drift.  ROM of spine: full.    Strength: Side Biceps Triceps Deltoid Interossei Grip Wrist Ext. Wrist Flex.  R '5 5 5 5 5 5 5  '$ L '5 5 5 5 4 '$ 4+ 5   Side Iliopsoas Quads Hamstring PF DF EHL  R '5 5 5 5 5 5  '$ L '5 5 5 5 5 5   '$ Reflexes are 1+ and symmetric at the biceps, triceps, brachioradialis, patella and achilles.   Hoffman's is present.   Bilateral upper and lower extremity sensation is intact to light touch.    No evidence of dysmetria noted.  Gait is abnormal - moderate difficulty with tandem gait.     Medical Decision Making  Imaging: MRI C spine 03/07/2022 Disc levels:   Foramen magnum is widely patent. Mild degenerative changes C1-2 but no stenosis.   C2-3: Minimal uncovertebral prominence.  No stenosis.   C3-4: Endplate osteophytes and bulging of the disc. Narrowing of the ventral subarachnoid space but no compression of cord. AP diameter of the canal in the midline 6.5 mm. There may be some cord atrophy at this level. Bilateral foraminal narrowing that could affect either C4 nerve.   C4-5: Spondylosis with endplate osteophytes and protruding disc material more prominent towards the left. Spinal stenosis with AP diameter of the  canal in the midline 6.2 mm. Cord volume loss without abnormal T2 signal, particularly evident on the left side. Bilateral foraminal stenosis that could compress either C5 nerve.   C5-6: Spondylosis with endplate osteophytes and bulging disc material more prominent towards the right. Narrowing of the canal with AP diameter in the midline 7 mm. Flattening and myelomalacia on the right side of the cord at this level. Foraminal stenosis right worse than left. Either C6 nerve could be affected, more likely the right.   C6-7: Endplate osteophytes and bulging of the disc. Narrowing of the canal, AP diameter in the midline 7.5 mm. Mild cord deformity. Bilateral foraminal stenosis that could affect either C7 nerve.   C7-T1: Uncovertebral prominence right  worse than left. Bilateral facet degeneration. No compressive canal stenosis. Bilateral foraminal stenosis right worse than left could affect either C8 nerve, particularly the right.   IMPRESSION: 1. 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 Canal stenosis with cord deformity, most severe at C4-5 where AP diameter of the canal in the midline is 6.2 mm. Cord volume loss at C4-5 worse on the left than the right. Myelomalacia additionally on the right at C5-6. 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 2. Bilateral foraminal stenosis at C3-4, C4-5, C5-6, C6-7 and C7-T1 that could cause neural compression on either or both sides.     Electronically Signed   By: Nelson Chimes M.D.   On: 03/07/2022 18:30  I have personally reviewed the images and agree with the above interpretation.  Assessment and Plan: Brenda Mckinney is a pleasant 62 y.o. 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  She has subjective weakness in her left grip.  She has abnormal balance and hyperreflexia.  She has myelomalacia in her spinal cord due to severe stenosis.  We reviewed her options.  We  discussed that surgical intervention was recommended at this time, as no conservative management has been shown to impact the natural history of cervical myelopathy.  She would like to watch her symptoms very closely.  I will see her back in clinic in 2 weeks.  We discussed the options including anterior cervical discectomy and fusion from C3-7 versus cervical laminoplasty with foraminotomies.  We reviewed that there is some risk that she would have a change in her voice with an anterior cervical approach.  She is an accomplished Biomedical scientist.  She will think about her options and return to see me in 2 weeks.    Thank you for involving me in the care of this patient.      Cashmere Harmes K. Izora Ribas MD, [facility]

## 2022-03-27 ENCOUNTER — Encounter: Payer: Self-pay | Admitting: Neurosurgery

## 2022-03-27 ENCOUNTER — Ambulatory Visit: Payer: BC Managed Care – PPO | Admitting: Neurosurgery

## 2022-03-27 VITALS — BP 150/86 | HR 50 | Ht 65.0 in | Wt 222.0 lb

## 2022-03-27 DIAGNOSIS — M5412 Radiculopathy, cervical region: Secondary | ICD-10-CM

## 2022-03-27 DIAGNOSIS — M4802 Spinal stenosis, cervical region: Secondary | ICD-10-CM

## 2022-03-27 DIAGNOSIS — G9589 Other specified diseases of spinal cord: Secondary | ICD-10-CM

## 2022-03-27 DIAGNOSIS — G959 Disease of spinal cord, unspecified: Secondary | ICD-10-CM | POA: Diagnosis not present

## 2022-03-27 MED ORDER — TIZANIDINE HCL 4 MG PO TABS: 2.0000 mg | ORAL_TABLET | Freq: Three times a day (TID) | ORAL | 1 refills | Status: DC

## 2022-03-27 MED ORDER — GABAPENTIN 100 MG PO CAPS: 100.0000 mg | ORAL_CAPSULE | Freq: Three times a day (TID) | ORAL | 0 refills | Status: DC

## 2022-04-02 ENCOUNTER — Telehealth: Payer: Self-pay

## 2022-04-02 NOTE — Telephone Encounter (Signed)
-----   Message from Peggyann Shoals sent at 04/02/2022  2:00 PM EST ----- Regarding: schedule sx Contact: [phone] She has discussed with her husband and wants to move forward with surgery. Next opening will be fine.

## 2022-04-04 NOTE — Telephone Encounter (Signed)
I spoke with Brenda Mckinney. She would like to proceed with surgery on 05/09/22. She has chosen the fusion. She doesn't feel she needs to come in on 04/10/22, so I canceled this appointment.  We discussed the following:   **we are planning to move our office location mid-March. Please check with [place] to see if we have moved to our new location prior to coming to your post op appointments after surgery. Our phone number will remain the same [phone]). Our new address will be: [facility] [address] [place], [place] [address]  Planned surgery: C3-7 anterior cervical discectomy and fusion   Surgery date: 05/09/22 - you will find out your arrival time the business day before your surgery.   Pre-op appointment at [facility]: we will call you with a date/time for this. Pre-admit testing is located on the first floor of the [facility], [place], [address]. Please bring all prescriptions in the original prescription bottles to your appointment, even if you have reviewed medications by phone with a pharmacy representative. Pre-op labs may be done at your pre-op appointment. You are not required to fast for these labs. Should you need to change your pre-op appointment, please call Pre-admit testing at [phone].    Surgical clearance: ***     ***Brace: [facility] will contact you regarding an appointment for the brace you will use after surgery. Their number is [phone] should you miss their call or have an issue with your brace after surgery. You will need to bring the brace to the hospital on the day of surgery.    Blood thinners: ok to stay on aspirin '81mg'$      NSAIDS (Non-steroidal anti-inflammatory drugs): because you are having a fusion, no NSAIDS (such as ibuprofen, aleve, naproxen, meloxicam, diclofenac) for 3 months after surgery. Celebrex is an exception. Tylenol is ok because it is not  an NSAID.    Because you are having a fusion: for appointments after your 2 week follow-up: please arrive at the [facility] outpatient imaging center ([place], [place]) or [facility] one hour prior to your appointment for x-rays. This applies to every appointment after your 2 week follow-up. Failure to do so may result in your appointment being rescheduled.    If you have FMLA/disability paperwork, please drop it off or fax it to [phone], attention [place].   We can be reached by phone or mychart 8am-4pm, Monday-Friday. If you have any questions/concerns before or after surgery, you can reach [place] at [phone], or you can send a mychart message. If you have a concern after hours that cannot wait until normal business hours, you can call [phone] and ask to page the neurosurgeon on call for [place].   Appointments/FMLA & disability paperwork: [place]  Nurse: Ophelia Shoulder  Medical assistants: Lum Keas Physician Assistant's: Catherine Surgeon: Meade Maw, MD

## 2022-04-10 ENCOUNTER — Ambulatory Visit: Payer: BC Managed Care – PPO | Admitting: Neurosurgery

## 2022-04-20 ENCOUNTER — Other Ambulatory Visit: Payer: Self-pay

## 2022-04-20 DIAGNOSIS — Z01818 Encounter for other preprocedural examination: Secondary | ICD-10-CM

## 2022-04-30 ENCOUNTER — Encounter
Admission: RE | Admit: 2022-04-30 | Discharge: 2022-04-30 | Disposition: A | Discharge status: Home / Self Care | Payer: BC Managed Care – PPO | Source: Ambulatory Visit | Attending: Neurosurgery | Admitting: Neurosurgery

## 2022-04-30 DIAGNOSIS — I10 Essential (primary) hypertension: Secondary | ICD-10-CM | POA: Insufficient documentation

## 2022-04-30 DIAGNOSIS — Z01812 Encounter for preprocedural laboratory examination: Secondary | ICD-10-CM | POA: Diagnosis present

## 2022-04-30 LAB — BASIC METABOLIC PANEL
Anion gap: 9 (ref 5–15)
BUN: 11 mg/dL (ref 8–23)
CO2: 27 mmol/L (ref 22–32)
Calcium: 9.2 mg/dL (ref 8.9–10.3)
Chloride: 104 mmol/L (ref 98–111)
Creatinine, Ser: 0.77 mg/dL (ref 0.44–1.00)
GFR, Estimated: >60 mL/min (ref >60)
Glucose, Bld: 116 mg/dL — ABNORMAL HIGH (ref 70–99)
Potassium: 3.5 mmol/L (ref 3.5–5.1)
Sodium: 140 mmol/L (ref 135–145)

## 2022-04-30 LAB — SURGICAL PCR SCREEN
MRSA, PCR: NEGATIVE
Staphylococcus aureus: NEGATIVE

## 2022-04-30 LAB — TYPE AND SCREEN
ABO/RH(D): O NEG
Antibody Screen: NEGATIVE

## 2022-04-30 NOTE — Patient Instructions (Addendum)
Your procedure is scheduled on: Wednesday, April 10 Report to the Registration Desk on the 1st floor of the [facility]. To find out your arrival time, please call [phone] between 1PM - 3PM on: Tuesday, April 9 If your arrival time is 6:00 am, do not arrive before that time as the [place] entrance doors do not open until 6:00 am.  REMEMBER: Instructions that are not followed completely may result in serious medical risk, up to and including death; or upon the discretion of your surgeon and anesthesiologist your surgery may need to be rescheduled.  Do not eat food after midnight the night before surgery.  No gum chewing or hard candies.  You may however, drink CLEAR liquids up to 2 hours before you are scheduled to arrive for your surgery. Do not drink anything within 2 hours of your scheduled arrival time.  Clear liquids include: - water  - apple juice without pulp - gatorade (not RED colors) - black coffee or tea (Do NOT add milk or creamers to the coffee or tea) Do NOT drink anything that is not on this list.  One week prior to surgery: starting April 3 Stop Anti-inflammatories (NSAIDS) such as Advil, Aleve, Ibuprofen, Motrin, Naproxen, Naprosyn and Aspirin based products such as Excedrin, Goody's Powder, BC Powder. Stop ANY OVER THE COUNTER supplements until after surgery. You may however, continue to take Tylenol if needed for pain up until the day of surgery.  Per Dr. Rhea Bleacher instructions; ok to stay on aspirin 81mg , Pre-op   Continue taking all prescribed medications   TAKE ONLY THESE MEDICATIONS THE MORNING OF SURGERY WITH A SIP OF WATER:  Albuterol nebulizer Breo inhaler Amlodipine Levothyroxine Metoprolol Prednisone  Bring your albuterol inhaler to the hospital.  No Alcohol for 24 hours before or after surgery.  No Smoking including e-cigarettes for 24 hours before surgery.  No chewable tobacco products for at least 6 hours before surgery.  No  nicotine patches on the day of surgery.  Do not use any "recreational" drugs for at least a week (preferably 2 weeks) before your surgery.  Please be advised that the combination of cocaine and anesthesia may have negative outcomes, up to and including death. If you test positive for cocaine, your surgery will be cancelled.  On the morning of surgery brush your teeth with toothpaste and water, you may rinse your mouth with mouthwash if you wish. Do not swallow any toothpaste or mouthwash.  Use CHG Soap as directed on instruction sheet.  Do not wear jewelry, make-up, hairpins, clips or nail polish.  Do not wear lotions, powders, or perfumes.   Do not shave body hair from the neck down 48 hours before surgery.  Contact lenses, hearing aids and dentures may not be worn into surgery.  Do not bring valuables to the hospital. [facility] is not responsible for any missing/lost belongings or valuables.   Notify your doctor if there is any change in your medical condition (cold, fever, infection).  Wear comfortable clothing (specific to your surgery type) to the hospital.  After surgery, you can help prevent lung complications by doing breathing exercises.  Take deep breaths and cough every 1-2 hours. Your doctor may order a device called an Incentive Spirometer to help you take deep breaths.  If you are being admitted to the hospital overnight, leave your suitcase in the car. After surgery it may be brought to your room.  In case of increased patient census, it may be necessary for  you, the patient, to continue your postoperative care in the Same Day Surgery department.  If you are being discharged the day of surgery, you will not be allowed to drive home. You will need a responsible individual to drive you home and stay with you for 24 hours after surgery.   If you are taking public transportation, you will need to have a responsible individual with you.  Please call the [facility]. at [phone] if you have any questions about these instructions.  Surgery Visitation Policy:  Patients having surgery or a procedure may have two visitors.  Children under the age of 58 must have an adult with them who is not the patient.  Inpatient Visitation:    Visiting hours are 7 a.m. to 8 p.m. Up to four visitors are allowed at one time in a patient room. The visitors may rotate out with other people during the day.  One visitor age 9 or older may stay with the patient overnight and must be in the room by 8 p.m.

## 2022-05-08 MED ORDER — FAMOTIDINE 20 MG PO TABS
20.0000 mg | ORAL_TABLET | Freq: Once | ORAL | Status: AC
  Administered 2022-05-09: 20 mg via ORAL

## 2022-05-08 MED ORDER — CHLORHEXIDINE GLUCONATE 0.12 % MT SOLN
15.0000 mL | Freq: Once | OROMUCOSAL | Status: AC
  Administered 2022-05-09: 15 mL via OROMUCOSAL

## 2022-05-08 MED ORDER — CEFAZOLIN IN SODIUM CHLORIDE 2-0.9 GM/100ML-% IV SOLN
2.0000 g | Freq: Once | INTRAVENOUS | Status: DC
  Filled 2022-05-08: qty 100

## 2022-05-08 MED ORDER — ORAL CARE MOUTH RINSE: 15.0000 mL | Freq: Once | OROMUCOSAL | Status: AC

## 2022-05-08 MED ORDER — LACTATED RINGERS IV SOLN: INTRAVENOUS | Status: DC

## 2022-05-08 MED ORDER — CEFAZOLIN SODIUM-DEXTROSE 2-4 GM/100ML-% IV SOLN
2.0000 g | INTRAVENOUS | Status: AC
  Administered 2022-05-09: 2 g via INTRAVENOUS

## 2022-05-09 ENCOUNTER — Inpatient Hospital Stay
Admission: RE | Admit: 2022-05-09 | Discharge: 2022-05-11 | DRG: 029 | Disposition: A | Discharge status: Home / Self Care | Payer: BC Managed Care – PPO | Attending: Neurosurgery | Admitting: Neurosurgery

## 2022-05-09 ENCOUNTER — Inpatient Hospital Stay: Payer: BC Managed Care – PPO

## 2022-05-09 ENCOUNTER — Inpatient Hospital Stay: Payer: BC Managed Care – PPO | Admitting: Anesthesiology

## 2022-05-09 ENCOUNTER — Encounter: Payer: Self-pay | Admitting: Neurosurgery

## 2022-05-09 ENCOUNTER — Inpatient Hospital Stay: Payer: BC Managed Care – PPO | Admitting: Urgent Care

## 2022-05-09 ENCOUNTER — Other Ambulatory Visit: Payer: Self-pay

## 2022-05-09 ENCOUNTER — Encounter
Admission: RE | Disposition: A | Discharge status: Home / Self Care | Payer: Self-pay | Source: Home / Self Care | Attending: Neurosurgery

## 2022-05-09 DIAGNOSIS — Z79890 Hormone replacement therapy (postmenopausal): Secondary | ICD-10-CM | POA: Diagnosis not present

## 2022-05-09 DIAGNOSIS — Z8673 Personal history of transient ischemic attack (TIA), and cerebral infarction without residual deficits: Secondary | ICD-10-CM | POA: Diagnosis not present

## 2022-05-09 DIAGNOSIS — I10 Essential (primary) hypertension: Secondary | ICD-10-CM | POA: Diagnosis present

## 2022-05-09 DIAGNOSIS — M4802 Spinal stenosis, cervical region: Secondary | ICD-10-CM | POA: Diagnosis present

## 2022-05-09 DIAGNOSIS — G959 Disease of spinal cord, unspecified: Secondary | ICD-10-CM | POA: Diagnosis present

## 2022-05-09 DIAGNOSIS — Z01818 Encounter for other preprocedural examination: Secondary | ICD-10-CM

## 2022-05-09 DIAGNOSIS — Z96641 Presence of right artificial hip joint: Secondary | ICD-10-CM | POA: Diagnosis present

## 2022-05-09 DIAGNOSIS — E890 Postprocedural hypothyroidism: Secondary | ICD-10-CM | POA: Diagnosis present

## 2022-05-09 DIAGNOSIS — M5412 Radiculopathy, cervical region: Principal | ICD-10-CM

## 2022-05-09 DIAGNOSIS — J45909 Unspecified asthma, uncomplicated: Secondary | ICD-10-CM | POA: Diagnosis present

## 2022-05-09 DIAGNOSIS — Z803 Family history of malignant neoplasm of breast: Secondary | ICD-10-CM

## 2022-05-09 DIAGNOSIS — Z807 Family history of other malignant neoplasms of lymphoid, hematopoietic and related tissues: Secondary | ICD-10-CM | POA: Diagnosis not present

## 2022-05-09 DIAGNOSIS — E785 Hyperlipidemia, unspecified: Secondary | ICD-10-CM | POA: Diagnosis present

## 2022-05-09 DIAGNOSIS — Z79899 Other long term (current) drug therapy: Secondary | ICD-10-CM | POA: Diagnosis not present

## 2022-05-09 DIAGNOSIS — G9589 Other specified diseases of spinal cord: Secondary | ICD-10-CM

## 2022-05-09 DIAGNOSIS — R112 Nausea with vomiting, unspecified: Secondary | ICD-10-CM | POA: Diagnosis not present

## 2022-05-09 DIAGNOSIS — Z7982 Long term (current) use of aspirin: Secondary | ICD-10-CM | POA: Diagnosis not present

## 2022-05-09 DIAGNOSIS — Z01812 Encounter for preprocedural laboratory examination: Principal | ICD-10-CM

## 2022-05-09 HISTORY — PX: ANTERIOR CERVICAL DECOMPRESSION/DISCECTOMY FUSION 4 LEVELS: SHX5556

## 2022-05-09 SURGERY — ANTERIOR CERVICAL DECOMPRESSION/DISCECTOMY FUSION 4 LEVELS
Anesthesia: General | Site: Spine Cervical

## 2022-05-09 MED ORDER — MONTELUKAST SODIUM 10 MG PO TABS
10.0000 mg | ORAL_TABLET | Freq: Every day | ORAL | Status: DC
  Administered 2022-05-09 – 2022-05-10 (×2): 10 mg via ORAL
  Filled 2022-05-09 (×2): qty 1

## 2022-05-09 MED ORDER — FENTANYL CITRATE (PF) 100 MCG/2ML IJ SOLN
INTRAMUSCULAR | Status: DC | PRN
  Administered 2022-05-09 (×2): 50 ug via INTRAVENOUS

## 2022-05-09 MED ORDER — ASPIRIN 81 MG PO CHEW
CHEWABLE_TABLET | ORAL | Status: AC
  Filled 2022-05-09: qty 1

## 2022-05-09 MED ORDER — CHLORHEXIDINE GLUCONATE 0.12 % MT SOLN
OROMUCOSAL | Status: AC
  Filled 2022-05-09: qty 15

## 2022-05-09 MED ORDER — OXYCODONE HCL 5 MG PO TABS
ORAL_TABLET | ORAL | Status: AC
  Filled 2022-05-09: qty 1

## 2022-05-09 MED ORDER — SUCCINYLCHOLINE CHLORIDE 200 MG/10ML IV SOSY
PREFILLED_SYRINGE | INTRAVENOUS | Status: DC | PRN
  Administered 2022-05-09: 100 mg via INTRAVENOUS

## 2022-05-09 MED ORDER — METOPROLOL TARTRATE 25 MG PO TABS
ORAL_TABLET | ORAL | Status: AC
  Filled 2022-05-09: qty 1

## 2022-05-09 MED ORDER — SUCCINYLCHOLINE CHLORIDE 200 MG/10ML IV SOSY
PREFILLED_SYRINGE | INTRAVENOUS | Status: AC
  Filled 2022-05-09: qty 10

## 2022-05-09 MED ORDER — AMLODIPINE BESYLATE 5 MG PO TABS
ORAL_TABLET | ORAL | Status: AC
  Filled 2022-05-09: qty 1

## 2022-05-09 MED ORDER — SODIUM CHLORIDE 0.9% FLUSH
3.0000 mL | Freq: Two times a day (BID) | INTRAVENOUS | Status: DC
  Administered 2022-05-09 – 2022-05-11 (×2): 3 mL via INTRAVENOUS

## 2022-05-09 MED ORDER — FENTANYL CITRATE (PF) 100 MCG/2ML IJ SOLN
INTRAMUSCULAR | Status: AC
  Filled 2022-05-09: qty 2

## 2022-05-09 MED ORDER — KETOROLAC TROMETHAMINE 15 MG/ML IJ SOLN
INTRAMUSCULAR | Status: AC
  Filled 2022-05-09: qty 1

## 2022-05-09 MED ORDER — POTASSIUM CHLORIDE CRYS ER 20 MEQ PO TBCR
EXTENDED_RELEASE_TABLET | ORAL | Status: AC
  Filled 2022-05-09: qty 1

## 2022-05-09 MED ORDER — KETOROLAC TROMETHAMINE 15 MG/ML IJ SOLN
15.0000 mg | Freq: Four times a day (QID) | INTRAMUSCULAR | Status: AC
  Administered 2022-05-09 – 2022-05-10 (×4): 15 mg via INTRAVENOUS

## 2022-05-09 MED ORDER — ALBUTEROL SULFATE (2.5 MG/3ML) 0.083% IN NEBU: 2.5000 mg | INHALATION_SOLUTION | RESPIRATORY_TRACT | Status: DC | PRN

## 2022-05-09 MED ORDER — CELECOXIB 200 MG PO CAPS
200.0000 mg | ORAL_CAPSULE | Freq: Two times a day (BID) | ORAL | Status: DC
  Administered 2022-05-11: 200 mg via ORAL

## 2022-05-09 MED ORDER — LIDOCAINE HCL (CARDIAC) PF 100 MG/5ML IV SOSY
PREFILLED_SYRINGE | INTRAVENOUS | Status: DC | PRN
  Administered 2022-05-09: 100 mg via INTRAVENOUS

## 2022-05-09 MED ORDER — SENNA 8.6 MG PO TABS
ORAL_TABLET | ORAL | Status: AC
  Filled 2022-05-09: qty 1

## 2022-05-09 MED ORDER — PROPOFOL 1000 MG/100ML IV EMUL
INTRAVENOUS | Status: AC
  Filled 2022-05-09: qty 100

## 2022-05-09 MED ORDER — 0.9 % SODIUM CHLORIDE (POUR BTL) OPTIME
TOPICAL | Status: DC | PRN
  Administered 2022-05-09: 500 mL

## 2022-05-09 MED ORDER — ENOXAPARIN SODIUM 40 MG/0.4ML IJ SOSY
40.0000 mg | PREFILLED_SYRINGE | INTRAMUSCULAR | Status: DC
  Administered 2022-05-10 – 2022-05-11 (×2): 40 mg via SUBCUTANEOUS

## 2022-05-09 MED ORDER — FENTANYL CITRATE (PF) 100 MCG/2ML IJ SOLN
25.0000 ug | INTRAMUSCULAR | Status: DC | PRN
  Administered 2022-05-09: 50 ug via INTRAVENOUS

## 2022-05-09 MED ORDER — LISINOPRIL 20 MG PO TABS
ORAL_TABLET | ORAL | Status: AC
  Filled 2022-05-09: qty 2

## 2022-05-09 MED ORDER — FLUTICASONE FUROATE-VILANTEROL 100-25 MCG/ACT IN AEPB: 1.0000 | INHALATION_SPRAY | Freq: Every day | RESPIRATORY_TRACT | Status: DC | PRN

## 2022-05-09 MED ORDER — PHENOL 1.4 % MT LIQD: 1.0000 | OROMUCOSAL | Status: DC | PRN

## 2022-05-09 MED ORDER — PROPOFOL 10 MG/ML IV BOLUS
INTRAVENOUS | Status: DC | PRN
  Administered 2022-05-09: 150 ug/kg/min via INTRAVENOUS
  Administered 2022-05-09: 150 mg via INTRAVENOUS
  Administered 2022-05-09: 120 ug/kg/min via INTRAVENOUS

## 2022-05-09 MED ORDER — HYDROCHLOROTHIAZIDE 25 MG PO TABS
25.0000 mg | ORAL_TABLET | Freq: Every day | ORAL | Status: DC
  Administered 2022-05-09 – 2022-05-11 (×3): 25 mg via ORAL

## 2022-05-09 MED ORDER — GABAPENTIN 100 MG PO CAPS
ORAL_CAPSULE | ORAL | Status: AC
  Filled 2022-05-09: qty 1

## 2022-05-09 MED ORDER — REMIFENTANIL HCL 1 MG IV SOLR
INTRAVENOUS | Status: AC
  Filled 2022-05-09: qty 1000

## 2022-05-09 MED ORDER — GABAPENTIN 300 MG PO CAPS
300.0000 mg | ORAL_CAPSULE | Freq: Once | ORAL | Status: AC
  Administered 2022-05-09: 300 mg via ORAL
  Filled 2022-05-09: qty 1

## 2022-05-09 MED ORDER — ONDANSETRON HCL 4 MG PO TABS
4.0000 mg | ORAL_TABLET | Freq: Four times a day (QID) | ORAL | Status: DC | PRN
  Administered 2022-05-11: 4 mg via ORAL

## 2022-05-09 MED ORDER — LIDOCAINE HCL (PF) 2 % IJ SOLN
INTRAMUSCULAR | Status: AC
  Filled 2022-05-09: qty 5

## 2022-05-09 MED ORDER — ONDANSETRON HCL 4 MG/2ML IJ SOLN
INTRAMUSCULAR | Status: AC
  Filled 2022-05-09: qty 2

## 2022-05-09 MED ORDER — ACETAMINOPHEN 325 MG PO TABS
650.0000 mg | ORAL_TABLET | ORAL | Status: DC | PRN
  Administered 2022-05-09 – 2022-05-11 (×2): 650 mg via ORAL

## 2022-05-09 MED ORDER — ACETAMINOPHEN 500 MG PO TABS
ORAL_TABLET | ORAL | Status: AC
  Filled 2022-05-09: qty 2

## 2022-05-09 MED ORDER — AMLODIPINE BESYLATE 5 MG PO TABS
5.0000 mg | ORAL_TABLET | Freq: Every day | ORAL | Status: DC
  Administered 2022-05-09 – 2022-05-11 (×3): 5 mg via ORAL

## 2022-05-09 MED ORDER — METHOCARBAMOL 1000 MG/10ML IJ SOLN
500.0000 mg | Freq: Four times a day (QID) | INTRAVENOUS | Status: DC | PRN
  Administered 2022-05-10: 500 mg via INTRAVENOUS
  Filled 2022-05-09: qty 500

## 2022-05-09 MED ORDER — ACETAMINOPHEN 500 MG PO TABS
1000.0000 mg | ORAL_TABLET | Freq: Once | ORAL | Status: AC
  Administered 2022-05-09: 1000 mg via ORAL

## 2022-05-09 MED ORDER — BUPIVACAINE-EPINEPHRINE (PF) 0.5% -1:200000 IJ SOLN
INTRAMUSCULAR | Status: AC
  Filled 2022-05-09: qty 30

## 2022-05-09 MED ORDER — PREDNISONE 5 MG PO TABS
5.0000 mg | ORAL_TABLET | Freq: Every day | ORAL | Status: DC
  Administered 2022-05-10: 5 mg via ORAL
  Filled 2022-05-09: qty 1

## 2022-05-09 MED ORDER — POTASSIUM CHLORIDE CRYS ER 20 MEQ PO TBCR
10.0000 meq | EXTENDED_RELEASE_TABLET | Freq: Every day | ORAL | Status: DC
  Administered 2022-05-10: 10 meq via ORAL

## 2022-05-09 MED ORDER — CEFAZOLIN SODIUM-DEXTROSE 2-4 GM/100ML-% IV SOLN
INTRAVENOUS | Status: AC
  Filled 2022-05-09: qty 100

## 2022-05-09 MED ORDER — BUPIVACAINE-EPINEPHRINE (PF) 0.5% -1:200000 IJ SOLN
INTRAMUSCULAR | Status: DC | PRN
  Administered 2022-05-09: 6 mL

## 2022-05-09 MED ORDER — PHENYLEPHRINE HCL-NACL 20-0.9 MG/250ML-% IV SOLN
INTRAVENOUS | Status: DC | PRN
  Administered 2022-05-09: 25 ug/min via INTRAVENOUS

## 2022-05-09 MED ORDER — METHOCARBAMOL 500 MG PO TABS: 500.0000 mg | ORAL_TABLET | Freq: Four times a day (QID) | ORAL | Status: DC | PRN

## 2022-05-09 MED ORDER — SODIUM CHLORIDE 0.9% FLUSH: 3.0000 mL | INTRAVENOUS | Status: DC | PRN

## 2022-05-09 MED ORDER — LISINOPRIL 20 MG PO TABS
40.0000 mg | ORAL_TABLET | Freq: Every day | ORAL | Status: DC
  Administered 2022-05-09 – 2022-05-11 (×3): 40 mg via ORAL

## 2022-05-09 MED ORDER — OXYCODONE HCL 5 MG PO TABS
5.0000 mg | ORAL_TABLET | ORAL | Status: DC | PRN
  Administered 2022-05-10 – 2022-05-11 (×3): 5 mg via ORAL

## 2022-05-09 MED ORDER — OXYCODONE HCL 5 MG/5ML PO SOLN: 5.0000 mg | Freq: Once | ORAL | Status: AC | PRN

## 2022-05-09 MED ORDER — FLUTICASONE PROPIONATE 50 MCG/ACT NA SUSP: 2.0000 | Freq: Every day | NASAL | Status: DC | PRN

## 2022-05-09 MED ORDER — REMIFENTANIL HCL 1 MG IV SOLR
INTRAVENOUS | Status: DC | PRN
  Administered 2022-05-09: .1 ug/kg/min via INTRAVENOUS

## 2022-05-09 MED ORDER — ONDANSETRON HCL 4 MG/2ML IJ SOLN
4.0000 mg | Freq: Four times a day (QID) | INTRAMUSCULAR | Status: DC | PRN
  Administered 2022-05-10 (×3): 4 mg via INTRAVENOUS

## 2022-05-09 MED ORDER — SURGIFLO WITH THROMBIN (HEMOSTATIC MATRIX KIT) OPTIME
TOPICAL | Status: DC | PRN
  Administered 2022-05-09: 1 via TOPICAL

## 2022-05-09 MED ORDER — METOPROLOL TARTRATE 25 MG PO TABS
25.0000 mg | ORAL_TABLET | Freq: Two times a day (BID) | ORAL | Status: DC
  Administered 2022-05-09 – 2022-05-11 (×4): 25 mg via ORAL

## 2022-05-09 MED ORDER — ACETAMINOPHEN 650 MG RE SUPP: 650.0000 mg | RECTAL | Status: DC | PRN

## 2022-05-09 MED ORDER — SENNA 8.6 MG PO TABS
1.0000 | ORAL_TABLET | Freq: Two times a day (BID) | ORAL | Status: DC
  Administered 2022-05-09 – 2022-05-11 (×4): 8.6 mg via ORAL

## 2022-05-09 MED ORDER — ONDANSETRON HCL 4 MG/2ML IJ SOLN
INTRAMUSCULAR | Status: DC | PRN
  Administered 2022-05-09: 4 mg via INTRAVENOUS

## 2022-05-09 MED ORDER — GABAPENTIN 100 MG PO CAPS
100.0000 mg | ORAL_CAPSULE | Freq: Every evening | ORAL | Status: DC | PRN
  Administered 2022-05-09: 100 mg via ORAL

## 2022-05-09 MED ORDER — SIMVASTATIN 40 MG PO TABS
40.0000 mg | ORAL_TABLET | Freq: Every day | ORAL | Status: DC
  Administered 2022-05-09 – 2022-05-10 (×2): 40 mg via ORAL
  Filled 2022-05-09 (×2): qty 1

## 2022-05-09 MED ORDER — LEVOTHYROXINE SODIUM 112 MCG PO TABS
112.0000 ug | ORAL_TABLET | Freq: Every day | ORAL | Status: DC
  Administered 2022-05-10 – 2022-05-11 (×2): 112 ug via ORAL

## 2022-05-09 MED ORDER — MENTHOL 3 MG MT LOZG: 1.0000 | LOZENGE | OROMUCOSAL | Status: DC | PRN

## 2022-05-09 MED ORDER — HYDROCHLOROTHIAZIDE 25 MG PO TABS
ORAL_TABLET | ORAL | Status: AC
  Filled 2022-05-09: qty 1

## 2022-05-09 MED ORDER — SODIUM CHLORIDE 0.9 % IV SOLN: INTRAVENOUS | Status: DC

## 2022-05-09 MED ORDER — OXYCODONE HCL 5 MG PO TABS
5.0000 mg | ORAL_TABLET | Freq: Once | ORAL | Status: AC | PRN
  Administered 2022-05-09: 5 mg via ORAL

## 2022-05-09 MED ORDER — DEXAMETHASONE SODIUM PHOSPHATE 10 MG/ML IJ SOLN
INTRAMUSCULAR | Status: AC
  Filled 2022-05-09: qty 1

## 2022-05-09 MED ORDER — ASPIRIN 81 MG PO CHEW
81.0000 mg | CHEWABLE_TABLET | Freq: Every day | ORAL | Status: DC
  Administered 2022-05-09 – 2022-05-11 (×3): 81 mg via ORAL

## 2022-05-09 MED ORDER — OXYCODONE HCL 5 MG PO TABS: 10.0000 mg | ORAL_TABLET | ORAL | Status: DC | PRN

## 2022-05-09 MED ORDER — HYDRALAZINE HCL 20 MG/ML IJ SOLN
INTRAMUSCULAR | Status: AC
  Filled 2022-05-09: qty 1

## 2022-05-09 MED ORDER — HYDRALAZINE HCL 20 MG/ML IJ SOLN
10.0000 mg | Freq: Once | INTRAMUSCULAR | Status: AC
  Administered 2022-05-09: 10 mg via INTRAVENOUS

## 2022-05-09 MED ORDER — GABAPENTIN 300 MG PO CAPS
ORAL_CAPSULE | ORAL | Status: AC
  Filled 2022-05-09: qty 1

## 2022-05-09 MED ORDER — SODIUM CHLORIDE 0.9 % IV SOLN: 250.0000 mL | INTRAVENOUS | Status: DC

## 2022-05-09 MED ORDER — ACETAMINOPHEN 325 MG PO TABS
ORAL_TABLET | ORAL | Status: AC
  Filled 2022-05-09: qty 2

## 2022-05-09 MED ORDER — FAMOTIDINE 20 MG PO TABS
ORAL_TABLET | ORAL | Status: AC
  Filled 2022-05-09: qty 1

## 2022-05-09 MED ORDER — DEXAMETHASONE SODIUM PHOSPHATE 10 MG/ML IJ SOLN
INTRAMUSCULAR | Status: DC | PRN
  Administered 2022-05-09: 10 mg via INTRAVENOUS

## 2022-05-09 SURGICAL SUPPLY — 58 items
ADH SKN CLS APL DERMABOND .7 (GAUZE/BANDAGES/DRESSINGS) ×1
AGENT HMST KT MTR STRL THRMB (HEMOSTASIS) ×2
ALLOGRAFT BONE FIBER KORE 1CC (Bone Implant) IMPLANT
BASIN KIT SINGLE STR (MISCELLANEOUS) ×1 IMPLANT
BULB RESERV EVAC DRAIN JP 100C (MISCELLANEOUS) IMPLANT
BUR NEURO DRILL SOFT 3.0X3.8M (BURR) ×1 IMPLANT
CHANNEL ROUND DRAIN AND TROCAR, 10 FR IMPLANT
DERMABOND ADVANCED .7 DNX12 (GAUZE/BANDAGES/DRESSINGS) ×1 IMPLANT
DRAIN CHANNEL JP 10F RND 20C F (MISCELLANEOUS) IMPLANT
DRAIN RND TROCAR 1/8FLUTED 10F (DRAIN) IMPLANT
DRAPE C ARM PK CFD 31 SPINE (DRAPES) ×1 IMPLANT
DRAPE LAPAROTOMY 77X122 PED (DRAPES) ×1 IMPLANT
DRAPE MICROSCOPE SPINE 48X150 (DRAPES) ×1 IMPLANT
DRSG OPSITE POSTOP 4X6 (GAUZE/BANDAGES/DRESSINGS) ×2 IMPLANT
DRSG TEGADERM 2-3/8X2-3/4 SM (GAUZE/BANDAGES/DRESSINGS) IMPLANT
ELECT REM PT RETURN 9FT ADLT (ELECTROSURGICAL) ×1
ELECTRODE REM PT RTRN 9FT ADLT (ELECTROSURGICAL) ×1 IMPLANT
FEE INTRAOP CADWELL SUPPLY NCS (MISCELLANEOUS) IMPLANT
FEE INTRAOP MONITOR IMPULS NCS (MISCELLANEOUS) IMPLANT
GLOVE BIOGEL PI IND STRL 6.5 (GLOVE) ×1 IMPLANT
GLOVE SURG SYN 6.5 ES PF (GLOVE) ×2 IMPLANT
GLOVE SURG SYN 6.5 PF PI (GLOVE) ×2 IMPLANT
GLOVE SURG SYN 8.5  E (GLOVE) ×3
GLOVE SURG SYN 8.5 E (GLOVE) ×3 IMPLANT
GLOVE SURG SYN 8.5 PF PI (GLOVE) ×3 IMPLANT
GOWN SRG LRG LVL 4 IMPRV REINF (GOWNS) ×2 IMPLANT
GOWN SRG XL LVL 3 NONREINFORCE (GOWNS) ×1 IMPLANT
GOWN STRL NON-REIN TWL XL LVL3 (GOWNS) ×1
GOWN STRL REIN LRG LVL4 (GOWNS) ×2
INTRAOP CADWELL SUPPLY FEE NCS (MISCELLANEOUS) ×1
INTRAOP DISP SUPPLY FEE NCS (MISCELLANEOUS) ×1
INTRAOP MONITOR FEE IMPULS NCS (MISCELLANEOUS) ×1
INTRAOP MONITOR FEE IMPULSE (MISCELLANEOUS) ×1
IV CATH ANGIO 12GX3 LT BLUE (NEEDLE) IMPLANT
KIT TURNOVER KIT A (KITS) ×1 IMPLANT
MANIFOLD NEPTUNE II (INSTRUMENTS) ×1 IMPLANT
NS IRRIG 1000ML POUR BTL (IV SOLUTION) ×1 IMPLANT
NS IRRIG 500ML POUR BTL (IV SOLUTION) IMPLANT
PACK LAMINECTOMY NEURO (CUSTOM PROCEDURE TRAY) ×1 IMPLANT
PAD ARMBOARD 7.5X6 YLW CONV (MISCELLANEOUS) ×2 IMPLANT
PIN CASPAR 14 (PIN) ×1 IMPLANT
PIN CASPAR 14MM (PIN) ×1
PLATE ACP 2.1H 82 4LVL (Plate) IMPLANT
SCREW ACP 3.5X17 S/D VARIA (Screw) IMPLANT
SCREW ACP VA ST 4.0 X 17 (Screw) IMPLANT
SCREW ACP VSD 3.5X19 (Screw) IMPLANT
SPACER C HEDRON 12X14 7M 7D (Spacer) IMPLANT
SPONGE KITTNER 5P (MISCELLANEOUS) ×2 IMPLANT
SURGIFLO W/THROMBIN 8M KIT (HEMOSTASIS) ×2 IMPLANT
SUT ETHILON 3-0 (SUTURE) IMPLANT
SUT SILK 2 0 (SUTURE)
SUT SILK 2-0 18XBRD TIE 12 (SUTURE) IMPLANT
SUT V-LOC 90 ABS DVC 3-0 CL (SUTURE) ×1 IMPLANT
SUT VIC AB 3-0 SH 8-18 (SUTURE) ×1 IMPLANT
SYR 20ML LL LF (SYRINGE) ×1 IMPLANT
TAPE CLOTH 3X10 WHT NS LF (GAUZE/BANDAGES/DRESSINGS) ×3 IMPLANT
TRAP FLUID SMOKE EVACUATOR (MISCELLANEOUS) ×1 IMPLANT
TRAY FOLEY MTR SLVR 16FR STAT (SET/KITS/TRAYS/PACK) IMPLANT

## 2022-05-09 NOTE — Transfer of Care (Signed)
Immediate Anesthesia Transfer of Care Note  Patient: Brenda Mckinney  Procedure(s) Performed: C3-7 ANTERIOR CERVICAL DISCECTOMY AND FUSION (Spine Cervical)  Patient Location: PACU  Anesthesia Type:General  Level of Consciousness: drowsy  Airway & Oxygen Therapy: Patient Spontanous Breathing and Patient connected to face mask oxygen  Post-op Assessment: Report given to RN, Post -op Vital signs reviewed and stable, and Patient moving all extremities  Post vital signs: Reviewed and stable  Last Vitals:  Vitals Value Taken Time  BP    Temp    Pulse 92 05/09/22 1919  Resp 23 05/09/22 1919  SpO2 100 % 05/09/22 1919  Vitals shown include unvalidated device data.  Last Pain:  Vitals:   05/09/22 1329  TempSrc: Oral  PainSc: 0-No pain         Complications: No notable events documented.

## 2022-05-09 NOTE — Anesthesia Preprocedure Evaluation (Addendum)
Anesthesia Evaluation  Patient identified by MRN, date of birth, ID band Patient awake    Reviewed: Allergy & Precautions, H&P , NPO status , Patient's Chart, lab work & pertinent test results, reviewed documented beta blocker date and time   Airway Mallampati: II   Neck ROM: full    Dental  (+) Poor Dentition, Dental Advidsory Given   Pulmonary neg pulmonary ROS, neg shortness of breath, asthma , neg sleep apnea, neg COPD   Pulmonary exam normal        Cardiovascular hypertension, On Medications (-) angina (-) Past MI and (-) CABG negative cardio ROS Normal cardiovascular exam Rhythm:regular Rate:Normal     Neuro/Psych 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  Neuromuscular disease CVA, Residual Symptoms  negative psych ROS   GI/Hepatic negative GI ROS, Neg liver ROS,,,  Endo/Other  Hypothyroidism    Renal/GU negative Renal ROS  negative genitourinary   Musculoskeletal   Abdominal   Peds  Hematology negative hematology ROS (+)   Anesthesia Other Findings Past Medical History: No date: Arthritis No date: Asthma No date: Bell's palsy No date: Cancer (HCC)     Comment:  thyroid No date: Hyperlipemia No date: Hypertension No date: Hypothyroidism No date: Stroke [facility])     Comment:  DATE UNKNOWN No date: Thyroid disease Past Surgical History: No date: ABDOMINAL HYSTERECTOMY No date: COLONOSCOPY No date: EYE SURGERY; Bilateral No date: THYROIDECTOMY 06/04/2019: TOTAL HIP ARTHROPLASTY; Right     Comment:  Procedure: RIGHT TOTAL HIP ARTHROPLASTY ANTERIOR               APPROACH;  Surgeon: Kennedy Bucker, MD;  Location: [facility]               ORS;  Service: Orthopedics;  Laterality: Right; No date: TUBAL LIGATION 07/25/2015: VIDEO ASSISTED THORACOSCOPY (VATS)/THOROCOTOMY; Right     Comment:  Procedure: RIGHT THOROCOSCOPY REMOVAL RIGHT LOWER LOBE               MASS, PREOP BRONCHOSCOPY;  Surgeon:  Hulda Marin, MD;                Location: [facility] ORS;  Service: General;  Laterality:               Right; BMI    Body Mass Index: 36.11 kg/m     Reproductive/Obstetrics negative OB ROS                             Anesthesia Physical Anesthesia Plan  ASA: 3  Anesthesia Plan: General ETT   Post-op Pain Management:    Induction: Intravenous  PONV Risk Score and Plan: 3 and Midazolam, Ondansetron and Dexamethasone  Airway Management Planned: Oral ETT  Additional Equipment:   Intra-op Plan:   Post-operative Plan: Extubation in OR  Informed Consent: I have reviewed the patients History and Physical, chart, labs and discussed the procedure including the risks, benefits and alternatives for the proposed anesthesia with the patient or authorized representative who has indicated his/her understanding and acceptance.     Dental Advisory Given  Plan Discussed with: Anesthesiologist, CRNA and Surgeon  Anesthesia Plan Comments: (Patient consented for risks of anesthesia including but not limited to:  - adverse reactions to medications - damage to eyes, teeth, lips or other oral mucosa - nerve damage due to positioning  - sore throat or hoarseness - Damage to heart, brain, nerves, lungs, other parts of body or loss of  life  Patient voiced understanding.)        Anesthesia Quick Evaluation

## 2022-05-09 NOTE — Op Note (Signed)
Indications: Brenda Mckinney is suffering from Cervical myelopathy G95.9 , Myelomalacia G95.89, Cervical stenosis of spinal canal M48.02 , Cervical radiculopathy M54.12 . she failed conservative management, and elected to proceed with surgery.  Findings: cervical stenosis  Preoperative Diagnosis: Cervical myelopathy G95.9 , Myelomalacia G95.89, Cervical stenosis of spinal canal M48.02 , Cervical radiculopathy M54.12  Postoperative Diagnosis: same   EBL: 50 ml IVF: see AR ml Drains: 1 placed Disposition: Extubated and Stable to PACU Complications: none  A foley catheter was placed.   Preoperative Note:   Risks of surgery discussed include: infection, bleeding, stroke, coma, death, paralysis, CSF leak, nerve/spinal cord injury, numbness, tingling, weakness, complex regional pain syndrome, recurrent stenosis and/or disc herniation, vascular injury, development of instability, neck/back pain, need for further surgery, persistent symptoms, development of deformity, and the risks of anesthesia. The patient understood these risks and agreed to proceed.  Operative Note:   Procedure: 1. Anterior Cervical Discectomy C3-4 including bilateral foraminotomies and end plate preparation  2. Anterior Cervical Discectomy C4-5 including bilateral foraminotomies and end plate preparation  3. Anterior Cervical Discectomy C5-6 including bilateral foraminotomies and end plate preparation  4. Anterior Cervical Discectomy C6-7 including bilateral foraminotomies and end plate preparation 5. Anterior Spinal Instrumentation C3 to 7 using Nuvasive ACP 6. Anterior arthrodesis from C3 to C7 with placement of biomechanical devices at C3-4, C4-5, C5-6, and C6-7 7. Use of the operative microscope 8. Use of intraoperative flouroscopy  PROCEDURE IN DETAIL: After obtaining informed consent, the patient taken to the operating room, placed in supine position, general anesthesia induced.  The patient had a small  shoulder roll placed behind their shoulders.  The patient received preop antibiotics and IV Decadron.  The patient had a neck incision outlined, was prepped and draped in usual sterile fashion. The incision was injected with local anesthetic.   An incision was opened, dissection taken down medial to the carotid artery and jugular vein, lateral to the trachea and esophagus.  The prevertebral fascia identified and a localizing x-ray demonstrated the correct level.  The longus colli were dissected laterally, and self-retaining retractors placed to open the operative field. The microscope was then brought into the field.    Caspar pins were placed into the vertebral bodies at C3 and C5. The distractor was then placed at C3-5, and the annuli at C3-4 and C4-5 were opened using a bovie.  Curettes and pituitary rongeurs used to remove the majority of disk, then the drill was used to remove the posterior osteophyte and begin the foraminotomies. The nerve hook was used to elevate the posterior longitudinal ligament, which was then removed with Kerrison rongeurs. The microblunt nerve hook could be passed out the foramen bilaterally at each level.   Meticulous hemostasis was obtained. A biomechanical device (Globus Hedron 7 mm height x 14 mm width by 12 mm depth) was placed at C3-4. A second biomechanical device (Globus Hedron 7 mm height x 14 mm width by 12 mm depth) was placed at C4-5. Each device had been filled with allograft for aid in arthrodesis.      The distractor was removed, and the [place] retractor repositioned. The pin at C3 was removed and bone wax was used for hemostasis. An additional pin was placed at C7.  The distractor was placed from C5-7, and the annuli at C5-6 and C6-7 were opened using a bovie.  Curettes and pituitary rongeurs used to remove the majority of disk, then the drill was used to remove the posterior osteophyte and begin  the foraminotomies. The nerve hook was used to elevate the  posterior longitudinal ligament, which was then removed with Kerrison rongeurs. The microblunt nerve hook could be passed out the foramen bilaterally at each level.   Meticulous hemostasis was obtained. A biomechanical device (Globus Hedron 7 mm height x 14 mm width by 12 mm depth) was placed at C5-6. A second biomechanical device (Globus Hedron 7 mm height x 14 mm width by 12 mm depth) was placed at C6-7. Each device had been filled with allograft for aid in arthrodesis.  The caspar distractor was removed and the pins at C5 and C7 were removed.  Bone wax was used for hemostasis.    A separate five segment, four level plate (82 mm Nuvasive ACP) was chosen.  Two screws placed in the vertebral bodies of all four segments, respectively making sure the screws were behind the locking mechanism.  Final AP and lateral radiographs were taken.   Please note that the plate is not inclusive to the biomechanical devices.  The anchoring mechanism of the plate is completely separate from the biomechanical devices.  With everything in good position, the wound was irrigated copiously with bacitracin-containing solution and meticulous hemostasis obtained.  Wound was closed in 2 layers using interrupted inverted 3-0 Vicryl sutures in the platysma and 3-0 monocryl in the skin.  The wound was dressed with dermabond, the head of bed at 30 degrees, taken to recovery room in stable condition.  No new postop neurological deficits were identified.  All counts were correct at the end of the case.   Neuromonitoring was used throughout with no changes.   I performed the entire procedure with the assistance of Drake Leach PA as an Designer, television/film set. Mrs. Doy Mince assisted during exposure but then I performed the critical portions myself.  Venetia Night MD

## 2022-05-09 NOTE — Anesthesia Postprocedure Evaluation (Signed)
Anesthesia Post Note  Patient: Adaobi Biagioni  Procedure(s) Performed: C3-7 ANTERIOR CERVICAL DISCECTOMY AND FUSION (Spine Cervical)  Patient location during evaluation: PACU Anesthesia Type: General Level of consciousness: awake and alert Pain management: pain level controlled Vital Signs Assessment: post-procedure vital signs reviewed and stable Respiratory status: spontaneous breathing, nonlabored ventilation, respiratory function stable and patient connected to nasal cannula oxygen Cardiovascular status: blood pressure returned to baseline and stable Postop Assessment: no apparent nausea or vomiting Anesthetic complications: no   No notable events documented.   Last Vitals:  Vitals:   05/09/22 2000 05/09/22 2041  BP: (!) 164/82 (!) 149/78  Pulse: 86 89  Resp: (!) 23 18  Temp:  36.6 C  SpO2: 96% 94%    Last Pain:  Vitals:   05/09/22 2041  TempSrc: Oral  PainSc:                  Cleda Mccreedy Jozalyn Baglio

## 2022-05-09 NOTE — Anesthesia Procedure Notes (Signed)
Procedure Name: Intubation Date/Time: 05/09/2022 3:10 PM  Performed by: Maryla Morrow., CRNAPre-anesthesia Checklist: Patient identified, Patient being monitored, Timeout performed, Emergency Drugs available and Suction available Patient Re-evaluated:Patient Re-evaluated prior to induction Oxygen Delivery Method: Circle system utilized Preoxygenation: Pre-oxygenation with 100% oxygen Induction Type: IV induction Ventilation: Mask ventilation without difficulty Laryngoscope Size: 3 and McGraph Grade View: Grade I Tube type: Oral Tube size: 7.0 mm Number of attempts: 1 Airway Equipment and Method: Stylet Placement Confirmation: ETT inserted through vocal cords under direct vision, positive ETCO2 and breath sounds checked- equal and bilateral Secured at: 21 cm Tube secured with: Tape Dental Injury: Teeth and Oropharynx as per pre-operative assessment

## 2022-05-09 NOTE — H&P (Signed)
Referring Physician:  No referring provider defined for this encounter.  Primary Physician:  Gracelyn NurseJohnston, John D, MD  History of Present Illness: 05/09/2022 Mrs. Kyung BaccaUpshire Tribble presents today with continued symptoms.   03/27/2022 Ms. Darrold JunkerVanessa Upshire-Tribble is here today with a chief complaint of difficulty with use of her left arm as well as pain into her left arm.  She has left grip weakness and trouble opening jars and doing other things.  Her left hand feels heavy.  She has lost some dexterity in her left hand.  She has been dropping items as well.  She reports that she has had trouble with her balance.  Her husband reports that she sometimes veers off to the side when walking as though she is unsure of her balance.  This has been ongoing for a few weeks.   She recently went to the emergency department due to worsening of her symptoms.  She obtained imaging at that time. Bowel/Bladder Dysfunction: none  Conservative measures:  Physical therapy:  has not participated in Multimodal medical therapy including regular antiinflammatories: tylenol, ibuprofen  Injections:  has not received epidural steroid injections  Past Surgery: denies  Darrold JunkerVanessa Upshire-Tribble has symptoms of cervical myelopathy.  The symptoms are causing a significant impact on the patient's life.   I have utilized the care everywhere function in epic to review the outside records available from external health systems.  Review of Systems:  A 10 point review of systems is negative, except for the pertinent positives and negatives detailed in the HPI.  Past Medical History: Past Medical History:  Diagnosis Date   Arthritis    Asthma    Bell's palsy    Cancer    thyroid   Hyperlipemia    Hypertension    Hypothyroidism    Lung, hamartoma 2017   Stroke    DATE UNKNOWN   Thyroid disease     Past Surgical History: Past Surgical History:  Procedure Laterality Date   ABDOMINAL HYSTERECTOMY  2001    CATARACT EXTRACTION W/ INTRAOCULAR LENS  IMPLANT, BILATERAL Bilateral 2020   COLONOSCOPY  09/10/2019   COLONOSCOPY  2011   TOTAL HIP ARTHROPLASTY Right 06/04/2019   Procedure: RIGHT TOTAL HIP ARTHROPLASTY ANTERIOR APPROACH;  Surgeon: Kennedy BuckerMenz, Michael, MD;  Location: [facility] OR[facility];  Service: Orthopedics;  Laterality: Right;   TOTAL THYROIDECTOMY  2011   TUBAL LIGATION  1987   VIDEO ASSISTED THORACOSCOPY (VATS)/THOROCOTOMY Right 07/25/2015   Procedure: RIGHT THOROCOSCOPY REMOVAL RIGHT LOWER LOBE MASS, PREOP BRONCHOSCOPY;  Surgeon: Hulda Marinimothy Oaks, MD;  Location: [facility];  Service: General;  Laterality: Right;    Allergies: Allergies as of 04/20/2022   (No Known Allergies)    Medications: Current Meds  Medication Sig   albuterol (PROVENTIL) (2.5 MG/3ML) 0.083% nebulizer solution Take 2.5 mg by nebulization every 6 (six) hours as needed for wheezing or shortness of breath.   Albuterol Sulfate 108 (90 Base) MCG/ACT AEPB Inhale 2 puffs into the lungs every 4 (four) hours as needed (shortness of breath or wheezing).    amLODipine (NORVASC) 5 MG tablet Take 5 mg by mouth daily.    aspirin EC 81 MG tablet Take 81 mg by mouth daily.   fluticasone (FLONASE) 50 MCG/ACT nasal spray Place 2 sprays into both nostrils daily as needed.   hydrochlorothiazide (HYDRODIURIL) 25 MG tablet Take 25 mg by mouth daily.   levothyroxine (SYNTHROID, LEVOTHROID) 112 MCG tablet Take 112 mcg by mouth daily before breakfast.    lisinopril (ZESTRIL) 40 MG  tablet Take 40 mg by mouth daily.   metoprolol tartrate (LOPRESSOR) 25 MG tablet Take 25 mg by mouth 2 (two) times daily.    montelukast (SINGULAIR) 10 MG tablet Take 10 mg by mouth at bedtime.   potassium chloride (KLOR-CON) 10 MEQ tablet Take 10 mEq by mouth daily.   predniSONE (DELTASONE) 5 MG tablet Take 5 mg by mouth daily with breakfast.   simvastatin (ZOCOR) 40 MG tablet Take 40 mg by mouth at bedtime.    Social History: Social History   Tobacco Use   Smoking  status: Never   Smokeless tobacco: Never  Vaping Use   Vaping Use: Never used  Substance Use Topics   Alcohol use: Yes    Comment: rare   Drug use: No    Family Medical History: Family History  Problem Relation Age of Onset   Breast cancer Paternal Aunt    Lymphoma Sister     Physical Examination: Vitals:   05/09/22 1329  BP: (!) 152/81  Pulse: 60  Resp: 17  Temp: 98.1 F (36.7 C)  SpO2: 96%   Heart sounds normal no MRG. Chest Clear to Auscultation Bilaterally.  General: Patient is well developed, well nourished, calm, collected, and in no apparent distress. Attention to examination is appropriate.  Neck:   Supple.  Full range of motion.  Respiratory: Patient is breathing without any difficulty.   NEUROLOGICAL:     Awake, alert, oriented to person, place, and time.  Speech is clear and fluent.   Cranial Nerves: Pupils equal round and reactive to light.  Facial tone is symmetric.  Facial sensation is symmetric. Shoulder shrug is symmetric. Tongue protrusion is midline.  There is no pronator drift.  ROM of spine: full.    Strength: Side Biceps Triceps Deltoid Interossei Grip Wrist Ext. Wrist Flex.  R 5 5 5 5 5 5 5   L 5 5 5 5 4  4+ 5   Side Iliopsoas Quads Hamstring PF DF EHL  R 5 5 5 5 5 5   L 5 5 5 5 5 5    Reflexes are 1+ and symmetric at the biceps, triceps, brachioradialis, patella and achilles.   Hoffman's is present.   Bilateral upper and lower extremity sensation is intact to light touch.    No evidence of dysmetria noted.  Gait is abnormal - moderate difficulty with tandem gait.     Medical Decision Making  Imaging: MRI C spine 03/07/2022 Disc levels:   Foramen magnum is widely patent. Mild degenerative changes C1-2 but no stenosis.   C2-3: Minimal uncovertebral prominence.  No stenosis.   C3-4: Endplate osteophytes and bulging of the disc. Narrowing of the ventral subarachnoid space but no compression of cord. AP diameter of the canal in the  midline 6.5 mm. There may be some cord atrophy at this level. Bilateral foraminal narrowing that could affect either C4 nerve.   C4-5: Spondylosis with endplate osteophytes and protruding disc material more prominent towards the left. Spinal stenosis with AP diameter of the canal in the midline 6.2 mm. Cord volume loss without abnormal T2 signal, particularly evident on the left side. Bilateral foraminal stenosis that could compress either C5 nerve.   C5-6: Spondylosis with endplate osteophytes and bulging disc material more prominent towards the right. Narrowing of the canal with AP diameter in the midline 7 mm. Flattening and myelomalacia on the right side of the cord at this level. Foraminal stenosis right worse than left. Either C6 nerve could be affected, more likely  the right.   C6-7: Endplate osteophytes and bulging of the disc. Narrowing of the canal, AP diameter in the midline 7.5 mm. Mild cord deformity. Bilateral foraminal stenosis that could affect either C7 nerve.   C7-T1: Uncovertebral prominence right worse than left. Bilateral facet degeneration. No compressive canal stenosis. Bilateral foraminal stenosis right worse than left could affect either C8 nerve, particularly the right.   IMPRESSION: 1. 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 Canal stenosis with cord deformity, most severe at C4-5 where AP diameter of the canal in the midline is 6.2 mm. Cord volume loss at C4-5 worse on the left than the right. Myelomalacia additionally on the right at C5-6. 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 2. Bilateral foraminal stenosis at C3-4, C4-5, C5-6, C6-7 and C7-T1 that could cause neural compression on either or both sides.     Electronically Signed   By: Paulina Fusi M.D.   On: 03/07/2022 18:30  I have personally reviewed the images and agree with the above interpretation.  Assessment and Plan: Ms. Golen is a  pleasant 62 y.o. 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  She has subjective weakness in her left grip.  She has abnormal balance and hyperreflexia.  She has myelomalacia in her spinal cord due to severe stenosis.  We will proceed with C3-7 ACDF.  I discussed the planned procedure at length with the patient, including the risks, benefits, alternatives, and indications. The risks discussed include but are not limited to bleeding, infection, need for reoperation, spinal fluid leak, stroke, vision loss, anesthetic complication, coma, paralysis, and even death. We also discussed the possibility of post-operative dysphagia, vocal cord paralysis, and the risk of adjacent segment disease in the future. I also described in detail that improvement was not guaranteed.  The patient expressed understanding of these risks, and asked that we proceed with surgery. I described the surgery in layman's terms, and gave ample opportunity for questions, which were answered to the best of my ability.   Janiqua Friscia K. Myer Haff MD, [facility]

## 2022-05-09 NOTE — Progress Notes (Addendum)
error 

## 2022-05-10 MED ORDER — METOPROLOL TARTRATE 25 MG PO TABS
ORAL_TABLET | ORAL | Status: AC
  Filled 2022-05-10: qty 1

## 2022-05-10 MED ORDER — ONDANSETRON HCL 4 MG/2ML IJ SOLN
INTRAMUSCULAR | Status: AC
  Filled 2022-05-10: qty 2

## 2022-05-10 MED ORDER — HYDROCHLOROTHIAZIDE 25 MG PO TABS
ORAL_TABLET | ORAL | Status: AC
  Filled 2022-05-10: qty 1

## 2022-05-10 MED ORDER — SENNA 8.6 MG PO TABS
ORAL_TABLET | ORAL | Status: AC
  Filled 2022-05-10: qty 1

## 2022-05-10 MED ORDER — LISINOPRIL 20 MG PO TABS
ORAL_TABLET | ORAL | Status: AC
  Filled 2022-05-10: qty 2

## 2022-05-10 MED ORDER — PROCHLORPERAZINE EDISYLATE 10 MG/2ML IJ SOLN
INTRAMUSCULAR | Status: AC
  Filled 2022-05-10: qty 2

## 2022-05-10 MED ORDER — KETOROLAC TROMETHAMINE 15 MG/ML IJ SOLN
INTRAMUSCULAR | Status: AC
  Filled 2022-05-10: qty 1

## 2022-05-10 MED ORDER — DEXAMETHASONE SODIUM PHOSPHATE 10 MG/ML IJ SOLN
INTRAMUSCULAR | Status: AC
  Filled 2022-05-10: qty 1

## 2022-05-10 MED ORDER — DEXAMETHASONE SODIUM PHOSPHATE 10 MG/ML IJ SOLN
10.0000 mg | Freq: Once | INTRAMUSCULAR | Status: AC
  Administered 2022-05-10: 10 mg via INTRAVENOUS

## 2022-05-10 MED ORDER — POTASSIUM CHLORIDE CRYS ER 20 MEQ PO TBCR
EXTENDED_RELEASE_TABLET | ORAL | Status: AC
  Filled 2022-05-10: qty 1

## 2022-05-10 MED ORDER — ASPIRIN 81 MG PO CHEW
CHEWABLE_TABLET | ORAL | Status: AC
  Filled 2022-05-10: qty 1

## 2022-05-10 MED ORDER — AMLODIPINE BESYLATE 5 MG PO TABS
ORAL_TABLET | ORAL | Status: AC
  Filled 2022-05-10: qty 1

## 2022-05-10 MED ORDER — CALCIUM CARBONATE ANTACID 500 MG PO CHEW
1.0000 | CHEWABLE_TABLET | Freq: Three times a day (TID) | ORAL | Status: DC | PRN
  Administered 2022-05-10: 200 mg via ORAL
  Filled 2022-05-10 (×2): qty 1

## 2022-05-10 MED ORDER — ORAL CARE MOUTH RINSE: 15.0000 mL | OROMUCOSAL | Status: DC | PRN

## 2022-05-10 MED ORDER — PROCHLORPERAZINE EDISYLATE 10 MG/2ML IJ SOLN
10.0000 mg | Freq: Four times a day (QID) | INTRAMUSCULAR | Status: DC | PRN
  Administered 2022-05-10: 10 mg via INTRAVENOUS

## 2022-05-10 MED ORDER — LEVOTHYROXINE SODIUM 112 MCG PO TABS
ORAL_TABLET | ORAL | Status: AC
  Filled 2022-05-10: qty 1

## 2022-05-10 MED ORDER — DEXAMETHASONE SODIUM PHOSPHATE 10 MG/ML IJ SOLN
4.0000 mg | Freq: Four times a day (QID) | INTRAMUSCULAR | Status: DC
  Administered 2022-05-11 (×2): 4 mg via INTRAVENOUS

## 2022-05-10 MED ORDER — ENOXAPARIN SODIUM 40 MG/0.4ML IJ SOSY
PREFILLED_SYRINGE | INTRAMUSCULAR | Status: AC
  Filled 2022-05-10: qty 0.4

## 2022-05-10 MED ORDER — HYDROMORPHONE HCL 1 MG/ML IJ SOLN: 0.2500 mg | INTRAMUSCULAR | Status: DC | PRN

## 2022-05-10 MED ORDER — HYDROMORPHONE HCL 1 MG/ML IJ SOLN
INTRAMUSCULAR | Status: AC
  Filled 2022-05-10: qty 0.5

## 2022-05-10 MED ORDER — OXYCODONE HCL 5 MG PO TABS
ORAL_TABLET | ORAL | Status: AC
  Filled 2022-05-10: qty 1

## 2022-05-10 NOTE — Progress Notes (Signed)
Continues to be very nauseated.  Will add compazine and dexamethasone to help with nausea.  Continue drain.

## 2022-05-10 NOTE — Progress Notes (Signed)
Pts BP 169/87, pt also having nausea/vomiting PRN Zofran given. MD Myer Haff notified, no orders received.   Per MD Leilani Able, he will round on pt soon

## 2022-05-10 NOTE — Progress Notes (Signed)
    Attending Progress Note  History: Yamel Gaughan is here for cervical myelopathy  POD1: Doing well.  Nauseated with medication - threw up this AM  Physical Exam: Vitals:   05/09/22 2307 05/10/22 0427  BP: (!) 155/90 (!) 172/95  Pulse: 92 80  Resp: 18 18  Temp: 98 F (36.7 C) 97.9 F (36.6 C)  SpO2: 94% 95%    AA Ox3 CNI  Strength:5/5 throughout BUE  Incision c/d/I  Drain 60   Data:  Other tests/results: n/a  Assessment/Plan:  Raschel Upshire-Tribble is doing well after C3-7 anterior cervical discectomy and fusion for cervical myelopathy.  She had nausea this morning with medications.  - mobilize - pain control - DVT prophylaxis - PTOT - monitor drain output.  Venetia Night MD, [facility] Department of Neurosurgery

## 2022-05-10 NOTE — Evaluation (Signed)
Occupational Therapy Evaluation Patient Details Name: Brenda Mckinney MRN: [id number] DOB: [birth year] Today's Date: 05/10/2022   History of Present Illness Pt is a 62 yo female s/p C3-C7 anterior disectomy and fusion. PMH of thyroid disease, bell's palsy, asthma, stroke, HTN, HLD, lung hamartoma.   Clinical Impression   Patient received for OT evaluation. See flowsheet below for details of function. Pt participated well in OT training on ADLs/IADLs s/p cervical C3-7 ACDF. Ready for d/c home once cleared by surgical team; has already completed PT.       Recommendations for follow up therapy are one component of a multi-disciplinary discharge planning process, led by the attending physician.  Recommendations may be updated based on patient status, additional functional criteria and insurance authorization.   Assistance Recommended at Discharge Intermittent Supervision/Assistance  Patient can return home with the following A little help with bathing/dressing/bathroom;Assistance with cooking/housework;Assist for transportation    Functional Status Assessment  Patient has had a recent decline in their functional status and demonstrates the ability to make significant improvements in function in a reasonable and predictable amount of time.  Equipment Recommendations  None recommended by OT    Recommendations for Other Services       Precautions / Restrictions Precautions Precautions: Fall Restrictions Weight Bearing Restrictions: No      Mobility Bed Mobility Overal bed mobility: Modified Independent Bed Mobility: Supine to Sit, Sit to Supine     Supine to sit: Modified independent (Device/Increase time) Sit to supine: Modified independent (Device/Increase time)   General bed mobility comments: reviewed log roll technique with pt and husband; discussed obtaining bedrail at home for ease of transfer.    Transfers Overall transfer level: Needs assistance Equipment used:  1 person hand held assist Transfers: Sit to/from Stand Sit to Stand: Supervision           General transfer comment: pt needing only intermittent handheld assist for slight loss of balance      Balance                                           ADL either performed or assessed with clinical judgement   ADL Overall ADL's : At baseline                                       General ADL Comments: Pt able to don aspen collar at EOB with cues. Donned shirt independently; able to don underwear and pants with effort; OT teaching on use of reacher for ease of donning pants/underwear. OT teaching on ordering a sock aide to assist with socks, as pt needs help with socks at baseline. Pt able to walk to bathroom with supervision without AD and toilet and wash hands independently; walking back to room pt needing handheld assist at the end of walking due to slight loss of balance; encouraged pt to use RW at home for added safety. Husband in room for second half of session for OT teaching. Discussed grooming at sink with avoiding bending or twisting by using cup to spit; reviewed lifting precaution. Pt able to demonstrate log roll in/out of bed; husband states he can assist as needed. Discussed showering, deferred to MD on wearing aspen collar in shower or not; recommended pt use her shower seat for safety.  Vision         Perception     Praxis      Pertinent Vitals/Pain Pain Assessment Pain Assessment: 0-10 Pain Score:  (unrated) Pain Location: neck Pain Descriptors / Indicators: Sore Pain Intervention(s): Limited activity within patient's tolerance, Monitored during session     Hand Dominance     Extremity/Trunk Assessment Upper Extremity Assessment Upper Extremity Assessment: Overall WFL for tasks assessed (Pt using BIL UE functionally throughout session; denies sensation changes)   Lower Extremity Assessment Lower Extremity Assessment: Overall  WFL for tasks assessed   Cervical / Trunk Assessment Cervical / Trunk Assessment: Neck Surgery   Communication Communication Communication: No difficulties   Cognition Arousal/Alertness: Awake/alert Behavior During Therapy: WFL for tasks assessed/performed Overall Cognitive Status: Within Functional Limits for tasks assessed                                 General Comments: Pleasant, cooperative, follows all cues     General Comments  Doing well; nausea/vomiting this morning; pt states she still doesn't feel great, but no N/V this session. left sitting EOB in care of husband and RN close by. Anticipating visit from surgeon this afternoon.    Exercises     Shoulder Instructions      Home Living Family/patient expects to be discharged to:: Private residence Living Arrangements: Spouse/significant other Available Help at Discharge: Family;Available 24 hours/day Type of Home: House Home Access: Stairs to enter Entergy CorporationEntrance Stairs-Number of Steps: 3 Entrance Stairs-Rails: Right Home Layout: One level     Bathroom Shower/Tub: Producer, television/film/videoWalk-in shower   Bathroom Toilet: Handicapped height     Home Equipment: Agricultural consultantolling Walker (2 wheels);Shower seat (2 RWs, 2 reachers)          Prior Functioning/Environment Prior Level of Function : Independent/Modified Independent;Driving             Mobility Comments: Pt denies falls; not using AD. ADLs Comments: Husband assists with socks and sometimes underwear. pt denies falls. Drives, cooks. Husband assists with cleaning. 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        OT Problem List:        OT Treatment/Interventions:      OT Goals(Current goals can be found in the care plan section) Acute Rehab OT Goals Patient Stated Goal: Go home; stop being nausous OT Goal Formulation: All assessment and education complete, DC therapy  OT Frequency:      Co-evaluation              AM-PAC OT "6 Clicks"  Daily Activity     Outcome Measure Help from another person eating meals?: None Help from another person taking care of personal grooming?: None Help from another person toileting, which includes using toliet, bedpan, or urinal?: None Help from another person bathing (including washing, rinsing, drying)?: A Little Help from another person to put on and taking off regular upper body clothing?: None Help from another person to put on and taking off regular lower body clothing?: A Little 6 Click Score: 22   End of Session Equipment Utilized During Treatment: Cervical collar Nurse Communication: Mobility status  Activity Tolerance: Patient tolerated treatment well Patient left: Other (comment);with nursing/sitter in room;with family/visitor present (sitting EOB)                   Time: [id number] OT Time Calculation (min): 43 min Charges:  OT General Charges $  OT Visit: 1 Visit OT Evaluation $OT Eval Moderate Complexity: 1 Mod OT Treatments $Self Care/Home Management : 23-37 mins  Linward Foster, MS, OTR/L   Alvester Morin 05/10/2022, 4:26 PM

## 2022-05-10 NOTE — Evaluation (Signed)
Physical Therapy Evaluation Patient Details Name: Brenda Mckinney MRN: [id number] DOB: 1960-04-05 Today's Date: 05/10/2022  History of Present Illness  Pt is a 62 yo female s/p C3-C7 anterior disectomy and fusion. PMH of thyroid disease, bell's palsy, asthma, stroke, HTN, HLD, lung hamartoma.   Clinical Impression  Patient alert, agreeable to PT, did report nausea and vomiting this AM, and 5/10 pain in her neck. Pt stated at baseline she is independent and her husband will be available to help at discharge.  The patient was instructed in BLT precautions as well as how to don and doff cervical brace. Supervision for bed mobility and handheld assist provided for transfers and ambulation. She was able to ambulate ~253ft and perform stair navigation as well. Pt more comfortable with at least unilateral support (handheld assist) though did often reach out for bilateral, discussed use of RW at discharge, pt agreeable (has one at home).  Overall the patient demonstrated deficits (see "PT Problem List") that impede the patient's functional abilities, safety, and mobility and would benefit from skilled PT intervention. Recommendation is to continue skilled PT services to maximize function and minimize pain.         Recommendations for follow up therapy are one component of a multi-disciplinary discharge planning process, led by the attending physician.  Recommendations may be updated based on patient status, additional functional criteria and insurance authorization.  Follow Up Recommendations       Assistance Recommended at Discharge Set up Supervision/Assistance  Patient can return home with the following  A little help with walking and/or transfers;Assistance with cooking/housework;Assist for transportation;Help with stairs or ramp for entrance    Equipment Recommendations None recommended by PT  Recommendations for Other Services       Functional Status Assessment Patient has had a  recent decline in their functional status and demonstrates the ability to make significant improvements in function in a reasonable and predictable amount of time.     Precautions / Restrictions Precautions Precautions: Fall Required Braces or Orthoses: Cervical Brace Cervical Brace: Hard collar (per orders: don while sitting, able to walk to bathroom without it) Restrictions Weight Bearing Restrictions: No Other Position/Activity Restrictions: BLTs      Mobility  Bed Mobility Overal bed mobility: Needs Assistance Bed Mobility: Supine to Sit     Supine to sit: Supervision          Transfers Overall transfer level: Needs assistance Equipment used: 1 person hand held assist Transfers: Sit to/from Stand Sit to Stand: Supervision                Ambulation/Gait Ambulation/Gait assistance: Supervision, Min guard Gait Distance (Feet): 200 Feet Assistive device: 1 person hand held assist         General Gait Details: pt tended to reach for at least unilateral UE support, discussed use of RW at home for patient comfort  Stairs Stairs: Yes Stairs assistance: Min guard Stair Management: One rail Right, Alternating pattern Number of Stairs: 4 General stair comments: slow but steady and safe  Wheelchair Mobility    Modified Rankin (Stroke Patients Only)       Balance Overall balance assessment: Needs assistance Sitting-balance support: Feet supported Sitting balance-Leahy Scale: Good     Standing balance support: Single extremity supported Standing balance-Leahy Scale: Good                               Pertinent Vitals/Pain Pain Assessment Pain  Assessment: 0-10 Pain Score: 5  Pain Location: neck Pain Descriptors / Indicators: Sore Pain Intervention(s): Limited activity within patient's tolerance, Monitored during session, Repositioned    Home Living Family/patient expects to be discharged to:: Private residence Living Arrangements:  Spouse/significant other Available Help at Discharge: Family;Available 24 hours/day Type of Home: House Home Access: Stairs to enter Entrance Stairs-Rails: Right Entrance Stairs-Number of Steps: 3   Home Layout: One level Home Equipment: Agricultural consultant (2 wheels);Rollator (4 wheels);Shower seat      Prior Function Prior Level of Function : Independent/Modified Independent;Driving               ADLs Comments: husband assists with socks     Hand Dominance        Extremity/Trunk Assessment   Upper Extremity Assessment Upper Extremity Assessment: Defer to OT evaluation    Lower Extremity Assessment Lower Extremity Assessment: Overall WFL for tasks assessed (grossly 4/5)    Cervical / Trunk Assessment Cervical / Trunk Assessment: Neck Surgery  Communication      Cognition Arousal/Alertness: Awake/alert Behavior During Therapy: WFL for tasks assessed/performed Overall Cognitive Status: Within Functional Limits for tasks assessed                                          General Comments      Exercises     Assessment/Plan    PT Assessment Patient needs continued PT services  PT Problem List Decreased mobility;Decreased range of motion;Decreased activity tolerance;Decreased balance;Pain       PT Treatment Interventions DME instruction;Therapeutic activities;Gait training;Therapeutic exercise;Patient/family education;Stair training;Balance training;Functional mobility training;Neuromuscular re-education    PT Goals (Current goals can be found in the Care Plan section)  Acute Rehab PT Goals Patient Stated Goal: to go home PT Goal Formulation: With patient Time For Goal Achievement: 05/24/22 Potential to Achieve Goals: Good    Frequency 7X/week     Co-evaluation               AM-PAC PT "6 Clicks" Mobility  Outcome Measure Help needed turning from your back to your side while in a flat bed without using bedrails?: None Help needed  moving from lying on your back to sitting on the side of a flat bed without using bedrails?: None Help needed moving to and from a bed to a chair (including a wheelchair)?: None Help needed standing up from a chair using your arms (e.g., wheelchair or bedside chair)?: None Help needed to walk in hospital room?: A Little Help needed climbing 3-5 steps with a railing? : A Little 6 Click Score: 22    End of Session Equipment Utilized During Treatment: Gait belt Activity Tolerance: Patient tolerated treatment well Patient left: in chair;with call bell/phone within reach;with nursing/sitter in room Nurse Communication: Mobility status PT Visit Diagnosis: Muscle weakness (generalized) (M62.81);Other abnormalities of gait and mobility (R26.89);Pain Pain - Right/Left:  (midline) Pain - part of body:  (cervical)    Time: 7622-6333 PT Time Calculation (min) (ACUTE ONLY): 31 min   Charges:   PT Evaluation $PT Eval Low Complexity: 1 Low PT Treatments $Therapeutic Activity: 23-37 mins        Olga Coaster PT, DPT 10:45 AM,05/10/22

## 2022-05-11 ENCOUNTER — Other Ambulatory Visit: Payer: Self-pay | Admitting: Neurosurgery

## 2022-05-11 MED ORDER — AMLODIPINE BESYLATE 5 MG PO TABS
ORAL_TABLET | ORAL | Status: AC
  Filled 2022-05-11: qty 1

## 2022-05-11 MED ORDER — OXYCODONE HCL 5 MG PO TABS
ORAL_TABLET | ORAL | Status: AC
  Filled 2022-05-11: qty 1

## 2022-05-11 MED ORDER — LISINOPRIL 20 MG PO TABS
ORAL_TABLET | ORAL | Status: AC
  Filled 2022-05-11: qty 2

## 2022-05-11 MED ORDER — ONDANSETRON HCL 4 MG PO TABS: 4.0000 mg | ORAL_TABLET | Freq: Every day | ORAL | 0 refills | Status: DC | PRN

## 2022-05-11 MED ORDER — CELECOXIB 200 MG PO CAPS
ORAL_CAPSULE | ORAL | Status: AC
  Filled 2022-05-11: qty 1

## 2022-05-11 MED ORDER — POTASSIUM CHLORIDE CRYS ER 20 MEQ PO TBCR
EXTENDED_RELEASE_TABLET | ORAL | Status: AC
  Filled 2022-05-11: qty 1

## 2022-05-11 MED ORDER — METOPROLOL TARTRATE 25 MG PO TABS
ORAL_TABLET | ORAL | Status: AC
  Filled 2022-05-11: qty 1

## 2022-05-11 MED ORDER — PROCHLORPERAZINE MALEATE 5 MG PO TABS: 5.0000 mg | ORAL_TABLET | Freq: Four times a day (QID) | ORAL | 0 refills | Status: DC | PRN

## 2022-05-11 MED ORDER — DEXAMETHASONE SODIUM PHOSPHATE 10 MG/ML IJ SOLN
INTRAMUSCULAR | Status: AC
  Filled 2022-05-11: qty 1

## 2022-05-11 MED ORDER — CELECOXIB 200 MG PO CAPS: 200.0000 mg | ORAL_CAPSULE | Freq: Two times a day (BID) | ORAL | 0 refills | Status: DC

## 2022-05-11 MED ORDER — PROCHLORPERAZINE EDISYLATE 10 MG/2ML IJ SOLN: 10.0000 mg | Freq: Four times a day (QID) | INTRAMUSCULAR | 0 refills | Status: DC | PRN

## 2022-05-11 MED ORDER — SENNOSIDES-DOCUSATE SODIUM 8.6-50 MG PO TABS
ORAL_TABLET | ORAL | Status: AC
  Filled 2022-05-11: qty 1

## 2022-05-11 MED ORDER — METHOCARBAMOL 500 MG PO TABS: 500.0000 mg | ORAL_TABLET | Freq: Four times a day (QID) | ORAL | 0 refills | Status: DC | PRN

## 2022-05-11 MED ORDER — ACETAMINOPHEN 325 MG PO TABS
ORAL_TABLET | ORAL | Status: AC
  Filled 2022-05-11: qty 2

## 2022-05-11 MED ORDER — ONDANSETRON HCL 4 MG PO TABS
ORAL_TABLET | ORAL | Status: AC
  Filled 2022-05-11: qty 1

## 2022-05-11 MED ORDER — SENNA 8.6 MG PO TABS: 1.0000 | ORAL_TABLET | Freq: Two times a day (BID) | ORAL | 0 refills | Status: DC

## 2022-05-11 MED ORDER — OXYBUTYNIN CHLORIDE 5 MG PO TABS
ORAL_TABLET | ORAL | Status: AC
  Filled 2022-05-11: qty 1

## 2022-05-11 MED ORDER — OXYCODONE HCL 5 MG PO TABS: 5.0000 mg | ORAL_TABLET | ORAL | 0 refills | Status: DC | PRN

## 2022-05-11 MED ORDER — LEVOTHYROXINE SODIUM 112 MCG PO TABS
ORAL_TABLET | ORAL | Status: AC
  Filled 2022-05-11: qty 1

## 2022-05-11 MED ORDER — ENOXAPARIN SODIUM 40 MG/0.4ML IJ SOSY
PREFILLED_SYRINGE | INTRAMUSCULAR | Status: AC
  Filled 2022-05-11: qty 0.4

## 2022-05-11 MED ORDER — HYDROCHLOROTHIAZIDE 25 MG PO TABS
ORAL_TABLET | ORAL | Status: AC
  Filled 2022-05-11: qty 1

## 2022-05-11 MED ORDER — ASPIRIN 81 MG PO CHEW
CHEWABLE_TABLET | ORAL | Status: AC
  Filled 2022-05-11: qty 1

## 2022-05-11 MED ORDER — SENNA 8.6 MG PO TABS
ORAL_TABLET | ORAL | Status: AC
  Filled 2022-05-11: qty 1

## 2022-05-11 NOTE — Progress Notes (Signed)
Patient has met discharge requirements safely and successfully. Patient has cervical collar. All discharge teaching done with patient and husband, all questions answered to satisfaction.

## 2022-05-11 NOTE — Discharge Instructions (Signed)
Your surgeon has performed an operation on your cervical spine (neck) to relieve pressure on the spinal cord and/or nerves. This involved making an incision in the front of your neck and removing one or more of the discs that support your spine. Next, a small piece of bone, a titanium plate, and screws were used to fuse two or more of the vertebrae (bones) together.  The following are instructions to help in your recovery once you have been discharged from the hospital. Even if you feel well, it is important that you follow these activity guidelines. If you do not let your neck heal properly from the surgery, you can increase the chance of return of your symptoms and other complications.  * Do not take anti-inflammatory medications for 3 months after surgery (naproxen [Aleve], ibuprofen [Advil, Motrin], etc.). These medications can prevent your bones from healing properly.  Celebrex, if prescribed, is ok to take.  Activity    No bending, lifting, or twisting ("BLT"). Avoid lifting objects heavier than 10 pounds (gallon milk jug).  Where possible, avoid household activities that involve lifting, bending, reaching, pushing, or pulling such as laundry, vacuuming, grocery shopping, and childcare. Try to arrange for help from friends and family for these activities while your back heals.  Increase physical activity slowly as tolerated.  Taking short walks is encouraged, but avoid strenuous exercise. Do not jog, run, bicycle, lift weights, or participate in any other exercises unless specifically allowed by your doctor.  Talk to your doctor before resuming sexual activity.  You should not drive until cleared by your doctor.  Until released by your doctor, you should not return to work or school.  You should rest at home and let your body heal.   You may shower three days after your surgery.  After showering, lightly dab your incision dry. Do not take a tub bath or go swimming until approved by your  doctor at your follow-up appointment.  If your doctor ordered a cervical collar (neck brace) for you, you should wear it whenever you are out of bed. You may remove it when lying down or sleeping, but you should wear it at all other times. Not all neck surgeries require a cervical collar.  If you smoke, we strongly recommend that you quit.  Smoking has been proven to interfere with normal bone healing and will dramatically reduce the success rate of your surgery. Please contact [facility] (800-QUIT-NOW) and use the resources at www.[facility].com for assistance in stopping smoking.  Surgical Incision   If you have a dressing on your incision, you may remove it two days after your surgery. Keep your incision area clean and dry.  If you have staples or stitches on your incision, you should have a follow up scheduled for removal. If you do not have staples or stitches, you will have steri-strips (small pieces of surgical tape) or Dermabond glue. The steri-strips/glue should begin to peel away within about a week (it is fine if the steri-strips fall off before then). If the strips are still in place one week after your surgery, you may gently remove them.  Diet           You may return to your usual diet. However, you may experience discomfort when swallowing in the first month after your surgery. This is normal. You may find that softer foods are more comfortable for you to swallow. Be sure to stay hydrated.  When to Contact Us  You may experience pain in your   neck and/or pain between your shoulder blades. This is normal and should improve in the next few weeks with the help of pain medication, muscle relaxers, and rest. Some patients report that a warm compress on the back of the neck or between the shoulder blades helps.  However, should you experience any of the following, contact us immediately: New numbness or weakness Pain that is progressively getting worse, and is not relieved by your pain  medication, muscle relaxers, rest, and warm compresses Bleeding, redness, swelling, pain, or drainage from surgical incision Chills or flu-like symptoms Fever greater than 101.0 F (38.3 C) Inability to eat, drink fluids, or take medications Problems with bowel or bladder functions Difficulty breathing or shortness of breath Warmth, tenderness, or swelling in your calf Contact Information During office hours (Monday-Friday 9 am to 5 pm), please call your physician at [phone] and ask for Kendelyn Jean After hours and weekends, please call [phone] and speak with the neurosurgeon on call For a life-threatening emergency, call 911  

## 2022-05-11 NOTE — Progress Notes (Signed)
    Attending Progress Note  History: Brenda Mckinney is here for cervical myelopathy  POD2: Nausea improved this morning POD1: Doing well.  Nauseated with medication - threw up this AM  Physical Exam: Vitals:   05/10/22 2230 05/11/22 0249  BP: (!) 167/85 (!) 184/90  Pulse: 64 66  Resp: 18 18  Temp: 98.6 F (37 C) 99 F (37.2 C)  SpO2: 94% 96%    AA Ox3 CNI  Strength:5/5 throughout BUE  Incision c/d/I  Drain 30  Data:  Other tests/results: n/a  Assessment/Plan:  Brenda Mckinney is doing well after C3-7 anterior cervical discectomy and fusion for cervical myelopathy.  She had nausea this morning with medications.  - mobilize - pain control - DVT prophylaxis - PTOT  Manning Charity PA-C Department of Neurosurgery

## 2022-05-11 NOTE — Progress Notes (Signed)
Physical Therapy Treatment Patient Details Name: Brenda Mckinney MRN: [id number] DOB: [birth year] Today's Date: 05/11/2022   History of Present Illness Pt is a 62 yo female s/p C3-C7 anterior disectomy and fusion. PMH of thyroid disease, bell's palsy, asthma, stroke, HTN, HLD, lung hamartoma.    PT Comments    Pt was supine in bed upon arrival. She is A and O x 4. Endorses feeling better today versus previous date. No nausea/vomiting. She was able to safely perform log roll to short sit without physical assistance. I'ly applied cervical brace in sitting prior to standing and ambulating. No LOB or safety concerns with ambulation. Safely performed ascending/descending stairs with ease. Cleared from an acute PT standpoint for safe DC home.   Recommendations for follow up therapy are one component of a multi-disciplinary discharge planning process, led by the attending physician.  Recommendations may be updated based on patient status, additional functional criteria and insurance authorization.     Assistance Recommended at Discharge Set up Supervision/Assistance  Patient can return home with the following A little help with walking and/or transfers;Assistance with cooking/housework;Assist for transportation;Help with stairs or ramp for entrance   Equipment Recommendations  None recommended by PT       Precautions / Restrictions Precautions Precautions: Fall Restrictions Weight Bearing Restrictions: No     Mobility  Bed Mobility Overal bed mobility: Modified Independent   Transfers Overall transfer level: Modified independent   Ambulation/Gait Ambulation/Gait assistance: Modified independent (Device/Increase time) Gait Distance (Feet): 200 Feet Assistive device: Rolling walker (2 wheels), None Gait Pattern/deviations: Step-through pattern Gait velocity: decreased  General Gait Details: Pt demonstrates safe steady gait kinematics   Stairs Stairs: Yes Stairs assistance:  Modified independent (Device/Increase time) Stair Management: One rail Right, Alternating pattern Number of Stairs: 4 General stair comments: no difficulty or safety concerns with stair performance    Balance Overall balance assessment: Modified Independent       Cognition Arousal/Alertness: Awake/alert Behavior During Therapy: WFL for tasks assessed/performed Overall Cognitive Status: Within Functional Limits for tasks assessed    General Comments: Pt is A and O x 4           General Comments General comments (skin integrity, edema, etc.): pt is eager for DC home. all equipment needs already met. cleared for safe DC home from a PT standpoint      Pertinent Vitals/Pain Pain Assessment Pain Assessment: 0-10 Pain Score: 2  Pain Location: neck Pain Descriptors / Indicators: Sore Pain Intervention(s): Limited activity within patient's tolerance, Premedicated before session, Monitored during session, Repositioned           PT Goals (current goals can now be found in the care plan section) Acute Rehab PT Goals Patient Stated Goal: to go home Progress towards PT goals: Progressing toward goals    Frequency    7X/week      PT Plan Current plan remains appropriate       AM-PAC PT "6 Clicks" Mobility   Outcome Measure  Help needed turning from your back to your side while in a flat bed without using bedrails?: None Help needed moving from lying on your back to sitting on the side of a flat bed without using bedrails?: None Help needed moving to and from a bed to a chair (including a wheelchair)?: None Help needed standing up from a chair using your arms (e.g., wheelchair or bedside chair)?: None Help needed to walk in hospital room?: None Help needed climbing 3-5 steps with a railing? : A  Little 6 Click Score: 23    End of Session Equipment Utilized During Treatment: Cervical collar Activity Tolerance: Patient tolerated treatment well Patient left: in chair;with  call bell/phone within reach;with nursing/sitter in room Nurse Communication: Mobility status PT Visit Diagnosis: Muscle weakness (generalized) (M62.81);Other abnormalities of gait and mobility (R26.89);Pain     Time: 0820-0830 PT Time Calculation (min) (ACUTE ONLY): 10 min  Charges:  $Gait Training: 8-22 mins                     Jetta Lout PTA 05/11/22, 10:54 AM

## 2022-05-11 NOTE — Discharge Summary (Signed)
Discharge Summary  Patient ID: Brenda Mckinney MRN: [id number] DOB/AGE: April 11, 1960 62 y.o.  Admit date: 05/09/2022 Discharge date: 05/11/2022  Admission Diagnoses: Cervical myelopathy G95.9 , Myelomalacia G95.89, Cervical stenosis of spinal canal M48.02 , Cervical radiculopathy M54.12   Discharge Diagnoses:  Principal Problem:   Cervical myelopathy Active Problems:   Cervical stenosis of spinal canal   Myelomalacia   Cervical radiculopathy   Discharged Condition: good  Hospital Course:  Brenda Mckinney is a pleasant 62 year old presenting with symptoms concerning of cervical myelopathy.  She underwent a C3-6 ACDF.  Her intraoperative course was uncomplicated and she was admitted for drain output monitoring, therapy evaluation, and pain control.  Her postoperative course was complicated by nausea and vomiting which improved with multiple medication changes.  She woke with physical therapy and was able to ambulate 200 feet and successfully navigate stairs.  She was deemed appropriate for discharge home.  Her drain was removed on postop day 2.  She was given prescriptions for oxycodone, Robaxin, senna, and medication for nausea.  Consults: None  Significant Diagnostic Studies: None  Treatments: surgery: Above.  Please see separately dictated operative report for further details.  Discharge Exam: Blood pressure (!) 169/99, pulse 71, temperature 97.9 F (36.6 C), resp. rate 16, height  (1.651 m), weight 102.1 kg, SpO2 96 %. AA Ox3 CNI   Strength:5/5 throughout BUE   Incision c/d/I with dermabond in place    Disposition: Discharge disposition: 01-Home or Self Care       Discharge Instructions     Incentive spirometry RT   Complete by: As directed       Allergies as of 05/11/2022   No Known Allergies      Medication List     STOP taking these medications    tiZANidine 4 MG tablet Commonly known as: Zanaflex       TAKE these medications     Albuterol Sulfate 108 (90 Base) MCG/ACT Aepb Commonly known as: PROAIR RESPICLICK Inhale 2 puffs into the lungs every 4 (four) hours as needed (shortness of breath or wheezing).   albuterol (2.5 MG/3ML) 0.083% nebulizer solution Commonly known as: PROVENTIL Take 2.5 mg by nebulization every 6 (six) hours as needed for wheezing or shortness of breath.   amLODipine 5 MG tablet Commonly known as: NORVASC Take 5 mg by mouth daily.   aspirin EC 81 MG tablet Take 81 mg by mouth daily.   Breo Ellipta 100-25 MCG/ACT Aepb Generic drug: fluticasone furoate-vilanterol Inhale 1 puff into the lungs daily as needed.   celecoxib 200 MG capsule Commonly known as: CELEBREX Take 1 capsule (200 mg total) by mouth every 12 (twelve) hours.   fluticasone 50 MCG/ACT nasal spray Commonly known as: FLONASE Place 2 sprays into both nostrils daily as needed.   gabapentin 100 MG capsule Commonly known as: Neurontin Take 1 capsule (100 mg total) by mouth 3 (three) times daily. What changed:  when to take this reasons to take this   hydrochlorothiazide 25 MG tablet Commonly known as: HYDRODIURIL Take 25 mg by mouth daily.   levothyroxine 112 MCG tablet Commonly known as: SYNTHROID Take 112 mcg by mouth daily before breakfast.   lisinopril 40 MG tablet Commonly known as: ZESTRIL Take 40 mg by mouth daily.   methocarbamol 500 MG tablet Commonly known as: ROBAXIN Take 1 tablet (500 mg total) by mouth every 6 (six) hours as needed for muscle spasms.   metoprolol tartrate 25 MG tablet Commonly known as: LOPRESSOR Take 25 mg  by mouth 2 (two) times daily.   montelukast 10 MG tablet Commonly known as: SINGULAIR Take 10 mg by mouth at bedtime.   oxyCODONE 5 MG immediate release tablet Commonly known as: Oxy IR/ROXICODONE Take 1 tablet (5 mg total) by mouth every 3 (three) hours as needed for moderate pain ((score 4 to 6)).   potassium chloride 10 MEQ tablet Commonly known as:  KLOR-CON Take 10 mEq by mouth daily.   predniSONE 5 MG tablet Commonly known as: DELTASONE Take 5 mg by mouth daily with breakfast.   prochlorperazine 10 MG/2ML injection Commonly known as: COMPAZINE Inject 2 mLs (10 mg total) into the vein every 6 (six) hours as needed for up to 7 days.   senna 8.6 MG Tabs tablet Commonly known as: SENOKOT Take 1 tablet (8.6 mg total) by mouth 2 (two) times daily.   simvastatin 40 MG tablet Commonly known as: ZOCOR Take 40 mg by mouth at bedtime.        Follow-up Information     [name], PA Follow up on 05/22/2022.   Specialty: Neurosurgery Contact information: [address] [place] [address] [phone]                 Signed: [name] 05/11/2022, 7:27 AM

## 2022-05-11 NOTE — Progress Notes (Signed)
Per Chauncey Fischer patient is to have dressing changed at drain removal site before d/c later this afternoon. This RN went ahead and changed the dressing due to the 2x2 being saturated in serosanguineous drainage. Applied new 2x2 and Tegaderm. At this time no drainage present on new dressing. Patient is also tolerating applesauce and ambulating to the bathroom without nausea this am. Will continue to monitor.

## 2022-05-15 ENCOUNTER — Encounter: Payer: Self-pay | Admitting: Neurosurgery

## 2022-05-17 ENCOUNTER — Telehealth: Payer: Self-pay

## 2022-05-17 NOTE — Telephone Encounter (Signed)
Brenda Mckinney called in to ask when she can remove her bandage. I advised her that she can remove it at this time.  She also reports bruises on different parts of her body.Marland KitchenMarland KitchenOne on her breast, one on both wrists, arms, etc. I explained that the bruises on her extremities are most likely from the placement of the monitoring system that occurred during surgery.   I encouraged her to contact us with any further questions/concerns. Otherwise, we will see her as scheduled next week.

## 2022-05-22 ENCOUNTER — Ambulatory Visit (INDEPENDENT_AMBULATORY_CARE_PROVIDER_SITE_OTHER): Payer: BC Managed Care – PPO | Admitting: Neurosurgery

## 2022-05-22 VITALS — BP 160/80 | HR 47 | Temp 97.8°F

## 2022-05-22 DIAGNOSIS — Z09 Encounter for follow-up examination after completed treatment for conditions other than malignant neoplasm: Secondary | ICD-10-CM

## 2022-05-22 DIAGNOSIS — G959 Disease of spinal cord, unspecified: Secondary | ICD-10-CM

## 2022-05-22 DIAGNOSIS — M5412 Radiculopathy, cervical region: Secondary | ICD-10-CM

## 2022-05-22 DIAGNOSIS — R29898 Other symptoms and signs involving the musculoskeletal system: Secondary | ICD-10-CM

## 2022-05-22 DIAGNOSIS — M4802 Spinal stenosis, cervical region: Secondary | ICD-10-CM

## 2022-05-22 MED ORDER — OXYCODONE HCL 5 MG PO TABS: 5.0000 mg | ORAL_TABLET | Freq: Three times a day (TID) | ORAL | 0 refills | Status: DC | PRN

## 2022-05-22 NOTE — Progress Notes (Signed)
   REFERRING PHYSICIAN:  Gracelyn Nurse, Md [address] [place],  [place] [address]  DOS: 05/07/22 C3-7 ACDF  HISTORY OF PRESENT ILLNESS: Nakea Gouger is approximately 2 weeks status post anterior cervical discectomy and fusion for cervical myelopathy and left arm weakness. Overall, she is doing fairly well after her recent surgery. She is having some trouble with swallowing but this is improving and she is tolerating most foods. She denies any incisional concerns. Her primary concern today is 2-3 days of difficulty with raising her left arm. She does have some continued left arm pain but states it is less severe and less consistent as it was before surgery. Her left hand is stronger. She continues to take Oxycodone 1-2 times per day.  PHYSICAL EXAMINATION:  NEUROLOGICAL:  General: In no acute distress.   Awake, alert, oriented to person, place, and time.  Pupils equal round and reactive to light.  Facial tone is symmetric.  Tongue protrusion is midline.  There is no pronator drift.  Strength: Side Biceps Triceps Deltoid Interossei Grip Wrist Ext. Wrist Flex.  R L Incision c/d/I and healing well   Imaging:  No interval imaging to review today  Assessment / Plan: Shine Scrogham is doing well after recetn ACDF. I have given her a refill of her Oxycodone. 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 I have referred her to [facility] for PT. We discussed activity escalation and I have advised the patient to lift up to 10 pounds until 6 weeks after surgery, then increase up to 25 pounds until 12 weeks after surgery.  After 12 weeks post-op, the patient advised to increase activity as tolerated. she will return to clinic in approximately 4 weeks with cervical xrays or sooner should she have any questions or concerns.   Advised to contact the office if any questions or concerns arise.   Manning Charity PA-C Dept of  Neurosurgery

## 2022-05-23 ENCOUNTER — Other Ambulatory Visit: Payer: Self-pay | Admitting: Neurosurgery

## 2022-05-23 MED ORDER — OXYCODONE HCL 5 MG PO TABS: 5.0000 mg | ORAL_TABLET | Freq: Three times a day (TID) | ORAL | 0 refills | Status: DC | PRN

## 2022-06-03 ENCOUNTER — Encounter: Payer: Self-pay | Admitting: Neurosurgery

## 2022-06-04 ENCOUNTER — Telehealth: Payer: Self-pay | Admitting: Neurosurgery

## 2022-06-04 MED ORDER — GABAPENTIN 100 MG PO CAPS: 100.0000 mg | ORAL_CAPSULE | Freq: Three times a day (TID) | ORAL | 0 refills | Status: DC

## 2022-06-04 NOTE — Telephone Encounter (Signed)
[facility] left voice message requesting refill Gabapentin 100mg 

## 2022-06-13 ENCOUNTER — Other Ambulatory Visit: Payer: Self-pay

## 2022-06-13 DIAGNOSIS — R29898 Other symptoms and signs involving the musculoskeletal system: Secondary | ICD-10-CM

## 2022-06-14 ENCOUNTER — Encounter: Payer: Self-pay | Admitting: Neurosurgery

## 2022-06-14 ENCOUNTER — Ambulatory Visit
Admission: RE | Admit: 2022-06-14 | Discharge: 2022-06-14 | Disposition: A | Discharge status: Home / Self Care | Payer: BC Managed Care – PPO | Source: Ambulatory Visit | Attending: Neurosurgery | Admitting: Neurosurgery

## 2022-06-14 ENCOUNTER — Ambulatory Visit (INDEPENDENT_AMBULATORY_CARE_PROVIDER_SITE_OTHER): Payer: BC Managed Care – PPO | Admitting: Neurosurgery

## 2022-06-14 ENCOUNTER — Ambulatory Visit
Admission: RE | Admit: 2022-06-14 | Discharge: 2022-06-14 | Disposition: A | Discharge status: Home / Self Care | Payer: BC Managed Care – PPO | Attending: Neurosurgery | Admitting: Neurosurgery

## 2022-06-14 VITALS — BP 171/91 | HR 52 | Temp 98.1°F

## 2022-06-14 DIAGNOSIS — R29898 Other symptoms and signs involving the musculoskeletal system: Secondary | ICD-10-CM

## 2022-06-14 DIAGNOSIS — M25512 Pain in left shoulder: Secondary | ICD-10-CM

## 2022-06-14 DIAGNOSIS — G959 Disease of spinal cord, unspecified: Secondary | ICD-10-CM

## 2022-06-14 DIAGNOSIS — G8929 Other chronic pain: Secondary | ICD-10-CM

## 2022-06-14 DIAGNOSIS — Z09 Encounter for follow-up examination after completed treatment for conditions other than malignant neoplasm: Secondary | ICD-10-CM

## 2022-06-14 NOTE — Progress Notes (Signed)
   REFERRING PHYSICIAN:  No referring provider defined for this encounter.  DOS: 05/07/22 C3-7 ACDF  HISTORY OF PRESENT ILLNESS: Brenda Mckinney is status post anterior cervical discectomy and fusion for cervical myelopathy and left arm weakness.   She has been having severe pain around her anterior shoulder over the past 4 weeks.  She has trouble lifting her left shoulder up.  Otherwise, she is doing relatively well.  Her swelling has improved.  Her voice is strong.    PHYSICAL EXAMINATION:  NEUROLOGICAL:  General: In no acute distress.   Awake, alert, oriented to person, place, and time.  Pupils equal round and reactive to light.  Facial tone is symmetric.  Tongue protrusion is midline.  There is no pronator drift.  Strength: Side Biceps Triceps Deltoid Interossei Grip Wrist Ext. Wrist Flex.  R 5 5 5 5 5 5 5   L 5 5 3 5 5 5 5    Incision c/d/I and healing well   She has tenderness to palpation in her anterior shoulder on the left.  She has some discomfort with range of motion.  Imaging:  No interval imaging to review today  Assessment / Plan: Brenda Mckinney is doing well after ACDF.  She either has a C5 palsy or has some primary shoulder pathology that is causing her pain and weakness.  She has significant discomfort with palpation which makes me concerned for her primary shoulder issue.  I will refer her to orthopedic surgery for evaluation.  She is not ready to return to work.  I we will delay her return to work to June 17 as I think she needs further time to recover with physical therapy.  Have given her exercises for her neck.  We will see how her images look before beginning to wean her brace.   Venetia Night MD Dept of Neurosurgery

## 2022-06-19 ENCOUNTER — Encounter: Payer: BC Managed Care – PPO | Admitting: Neurosurgery

## 2022-07-10 ENCOUNTER — Telehealth: Payer: Self-pay | Admitting: Neurosurgery

## 2022-07-10 NOTE — Telephone Encounter (Signed)
C3-7 ACDF on 05/07/22 Patient returning to work on 07/16/2022. She needs a letter to be released back to work with the normal restrictions at this point in her recovery. She will pick it up tomorrow.

## 2022-07-10 NOTE — Telephone Encounter (Signed)
Patient notified

## 2022-07-10 NOTE — Telephone Encounter (Signed)
Work note completed and placed up front for pick up. 

## 2022-07-23 ENCOUNTER — Ambulatory Visit
Admission: RE | Admit: 2022-07-23 | Discharge: 2022-07-23 | Disposition: A | Discharge status: Home / Self Care | Payer: BC Managed Care – PPO | Source: Ambulatory Visit | Attending: Neurosurgery | Admitting: Neurosurgery

## 2022-07-23 ENCOUNTER — Ambulatory Visit
Admission: RE | Admit: 2022-07-23 | Discharge: 2022-07-23 | Disposition: A | Discharge status: Home / Self Care | Payer: BC Managed Care – PPO | Attending: Neurosurgery | Admitting: Neurosurgery

## 2022-07-23 ENCOUNTER — Other Ambulatory Visit: Payer: Self-pay

## 2022-07-23 DIAGNOSIS — G959 Disease of spinal cord, unspecified: Secondary | ICD-10-CM | POA: Diagnosis present

## 2022-07-24 ENCOUNTER — Ambulatory Visit (INDEPENDENT_AMBULATORY_CARE_PROVIDER_SITE_OTHER): Payer: BC Managed Care – PPO | Admitting: Neurosurgery

## 2022-07-24 VITALS — BP 132/80 | Ht 65.0 in | Wt 225.0 lb

## 2022-07-24 DIAGNOSIS — G959 Disease of spinal cord, unspecified: Secondary | ICD-10-CM

## 2022-07-24 DIAGNOSIS — Z09 Encounter for follow-up examination after completed treatment for conditions other than malignant neoplasm: Secondary | ICD-10-CM

## 2022-07-24 DIAGNOSIS — M5412 Radiculopathy, cervical region: Secondary | ICD-10-CM

## 2022-07-24 DIAGNOSIS — Z981 Arthrodesis status: Secondary | ICD-10-CM

## 2022-07-24 DIAGNOSIS — M4802 Spinal stenosis, cervical region: Secondary | ICD-10-CM

## 2022-07-24 MED ORDER — CELECOXIB 200 MG PO CAPS: 200.0000 mg | ORAL_CAPSULE | Freq: Two times a day (BID) | ORAL | 0 refills | Status: DC

## 2022-07-24 MED ORDER — GABAPENTIN 300 MG PO CAPS: 300.0000 mg | ORAL_CAPSULE | Freq: Every evening | ORAL | 0 refills | Status: DC

## 2022-07-24 NOTE — Progress Notes (Signed)
   REFERRING PHYSICIAN:  No referring provider defined for this encounter.  DOS: 05/07/22 C3-7 ACDF  HISTORY OF PRESENT ILLNESS:  Brenda Mckinney presents today for 6 week follow up.  She was seen by orthopedics and had a shoulder injection which provided her with some relief of her shoulder pain however she does continue to have significant radiating pain into her arm and left thumb that is unchanged from surgery.   06/14/22 (Dr. Micheline Chapman) [name] is status post anterior cervical discectomy and fusion for cervical myelopathy and left arm weakness.   She has been having severe pain around her anterior shoulder over the past 4 weeks.  She has trouble lifting her left shoulder up.  Otherwise, she is doing relatively well.  Her swelling has improved.  Her voice is strong.    PHYSICAL EXAMINATION:  NEUROLOGICAL:  General: In no acute distress.   Awake, alert, oriented to person, place, and time.  Pupils equal round and reactive to light.  Facial tone is symmetric.   Strength: Side Biceps Triceps Deltoid Interossei Grip Wrist Ext. Wrist Flex.  R 5 5 5 5 5 5 5   L 5 5 3 5 5 5 5    Incision c/d/I and healing well   She has tenderness to palpation in her anterior shoulder on the left.  She has some discomfort with range of motion.  Imaging:  07/24/22 cervical xrays  Without evidence of hardware malfunction  Assessment / Plan: Kathline Banbury is doing fair after ACDF.  While the strength and pain in her left shoulder is improved, she still has symptoms concerning for radiculopathy.  I have ordered an updated cervical MRI for further evaluation of this.  I encouraged her to continue with physical therapy in the interim.  We have also increased her gabapentin to 300 mg at night.  She was encouraged to continue her 100 mg in the morning and afternoon.  I have refilled Celebrex at her request.  She is scheduled for follow-up with Dr. Marcell Barlow in 3 months with x-rays  prior however I will set up a telephone call with her after completion of her MRI discuss findings and further plan of care.  Should the MRI not show any ongoing nerve compression, we will move forward with a upper extremity EMG.  I spent a total of 30 minutes in both face-to-face and non-face-to-face activities for this visit on the date of this encounter including review of imaging, review of symptoms, discussion of medications and their side effects, discussion of differential diagnosis, physical exam, documentation, and order placement.  Manning Charity PA-C Dept of Neurosurgery

## 2022-07-24 NOTE — Patient Instructions (Addendum)
-   Increase Gabapentin to 300mg  at night. I will send a prescription for this to your pharmacy. Continue 100mg  morning and afternoon. - resume Celebrex twice daily. I will send in a refill for this.  - I have placed an order for MRI of your neck. The hospital should call you to schedule this.  If you do not hear from them by this time next week please let me know. Once the MRI is completed and I have had time to review the results I will set up a telephone call to review them and talk about next steps - follow up with Dr. Myer Haff in 3 months with xrays

## 2022-07-28 ENCOUNTER — Ambulatory Visit
Admission: RE | Admit: 2022-07-28 | Discharge: 2022-07-28 | Disposition: A | Discharge status: Home / Self Care | Payer: BC Managed Care – PPO | Source: Ambulatory Visit | Attending: Neurosurgery | Admitting: Neurosurgery

## 2022-07-28 DIAGNOSIS — Z981 Arthrodesis status: Secondary | ICD-10-CM | POA: Diagnosis present

## 2022-07-28 DIAGNOSIS — M5412 Radiculopathy, cervical region: Secondary | ICD-10-CM | POA: Diagnosis present

## 2022-08-10 ENCOUNTER — Ambulatory Visit (INDEPENDENT_AMBULATORY_CARE_PROVIDER_SITE_OTHER): Payer: BC Managed Care – PPO | Admitting: Neurosurgery

## 2022-08-10 DIAGNOSIS — M5412 Radiculopathy, cervical region: Secondary | ICD-10-CM

## 2022-08-10 DIAGNOSIS — R29898 Other symptoms and signs involving the musculoskeletal system: Secondary | ICD-10-CM

## 2022-08-10 DIAGNOSIS — Z981 Arthrodesis status: Secondary | ICD-10-CM

## 2022-08-10 NOTE — Progress Notes (Signed)
I called Brenda Mckinney this morning to review her MRI results (see below).  While she does continue to have some right radiating arm pain, it seems to be less severe over the last couple of days.  She is currently recovering from COVID and wonders if this is part of why she is noticing her arm pain little bit less.  We discussed that she does have some ongoing compression of her nerve roots which is likely the explanation for her arm pain but that her spinal cord is well decompressed.  I recommended finishing physical therapy, she has missed the last couple of sessions due to being out of town and now her illness.  When she has completed physical therapy we will touch base via telephone visit to discuss whether or not it is appropriate to move forward with an injection versus have her see Dr. Marcell Barlow to discuss potential further neurosurgical intervention.  She was encouraged to call our office in the interim should she have any questions or concerns.  She expressed understanding and was in agreement with this plan.  Manning Charity PA-C Neurosurgery

## 2022-09-06 ENCOUNTER — Telehealth: Payer: BC Managed Care – PPO | Admitting: Neurosurgery

## 2022-09-06 ENCOUNTER — Ambulatory Visit (INDEPENDENT_AMBULATORY_CARE_PROVIDER_SITE_OTHER): Payer: BC Managed Care – PPO | Admitting: Neurosurgery

## 2022-09-06 DIAGNOSIS — M5412 Radiculopathy, cervical region: Secondary | ICD-10-CM | POA: Diagnosis not present

## 2022-09-06 DIAGNOSIS — Z981 Arthrodesis status: Secondary | ICD-10-CM | POA: Diagnosis not present

## 2022-09-06 DIAGNOSIS — Z09 Encounter for follow-up examination after completed treatment for conditions other than malignant neoplasm: Secondary | ICD-10-CM | POA: Diagnosis not present

## 2022-09-06 NOTE — Progress Notes (Signed)
Neurosurgery Telephone (Audio-Only) Note  Requesting Provider     Gracelyn Nurse, MD [address] [place],  [place] [address] T: [phone] F: [phone]  Primary Care Provider Gracelyn Nurse, MD [address] [place] [address] T: [phone] F: [phone]  Telehealth visit was conducted with Brenda Mckinney, a 62 y.o. female via telephone.  DOS: 05/07/22 C3-7 ACDF   History of Present Illness: Brenda Mckinney is a 62 y.o presenting today via telephone visit. Unfortunately since she has completed the steroids she was on for COVID her arm pain has returned to its previous severity. She does note improvement with her arm strength however. She has since completed PT.   07/24/22 Brenda Mckinney presents today for 6 week follow up.  She was seen by orthopedics and had a shoulder injection which provided her with some relief of her shoulder pain however she does continue to have significant radiating pain into her arm and left thumb that is unchanged from surgery.     06/14/22 (Dr. Micheline Chapman) Brenda Mckinney is status post anterior cervical discectomy and fusion for cervical myelopathy and left arm weakness.    She has been having severe pain around her anterior shoulder over the past 4 weeks.  She has trouble lifting her left shoulder up.  Otherwise, she is doing relatively well.   Her swelling has improved.  Her voice is strong.  General Review of Systems:  A ROS was performed including pertinent positive and negatives as documented.  All other systems are negative.    Prior to Admission medications   Medication Sig Start Date End Date Taking? Authorizing Provider  acetaminophen (TYLENOL) 500 MG tablet Take 1,000 mg by mouth every 8 (eight) hours as needed.    [provider]  albuterol (PROVENTIL) (2.5 MG/3ML) 0.083% nebulizer solution Take 2.5 mg by nebulization every 6 (six) hours as needed for wheezing or  shortness of breath.    [provider]  Albuterol Sulfate 108 (90 Base) MCG/ACT AEPB Inhale 2 puffs into the lungs every 4 (four) hours as needed (shortness of breath or wheezing).     [provider]  amLODipine (NORVASC) 5 MG tablet Take 5 mg by mouth daily.     [provider]  aspirin EC 81 MG tablet Take 81 mg by mouth daily.    [provider]  celecoxib (CELEBREX) 200 MG capsule Take 1 capsule (200 mg total) by mouth every 12 (twelve) hours. 07/24/22   [name], PA  fluticasone furoate-vilanterol (BREO ELLIPTA) 100-25 MCG/ACT AEPB Inhale 1 puff into the lungs daily as needed.    [provider]  gabapentin (NEURONTIN) 100 MG capsule Take 1 capsule (100 mg total) by mouth 3 (three) times daily. 06/04/22   Venetia Night, MD  gabapentin (NEURONTIN) 300 MG capsule Take 1 capsule (300 mg total) by mouth at bedtime. 07/24/22   [name], PA  hydrochlorothiazide (HYDRODIURIL) 25 MG tablet Take 25 mg by mouth daily.    [provider]  levothyroxine (SYNTHROID, LEVOTHROID) 112 MCG tablet Take 112 mcg by mouth daily before breakfast.     [provider]  lisinopril (ZESTRIL) 40 MG tablet Take 40 mg by mouth daily.    [provider]  methocarbamol (ROBAXIN) 500 MG tablet Take 1 tablet (500 mg total) by mouth every 6 (six) hours as needed for muscle spasms. 05/11/22   [name], PA  metoprolol tartrate (LOPRESSOR) 25 MG tablet Take 25 mg by mouth  2 (two) times daily.     [provider]  montelukast (SINGULAIR) 10 MG tablet Take 10 mg by mouth at bedtime.    [provider]  potassium chloride (KLOR-CON) 10 MEQ tablet Take 10 mEq by mouth daily.    [provider]  predniSONE (DELTASONE) 5 MG tablet Take 5 mg by mouth daily with breakfast.    [provider]  simvastatin (ZOCOR) 40 MG tablet Take 40 mg by mouth at bedtime.    [provider]    Imaging  Studies  MRI 07/28/22 cervical spine IMPRESSION: 1. Interval ACDF at C3-4, C4-5, C5-6, and C6-7 with decompression of the central canal at these levels. 2. Moderate foraminal narrowing bilaterally at C3-4, C4-5, C5-6, and C6-7. 3. Residual foraminal narrowing is greatest at C3-4 and C5-6. 4. Moderate right foraminal stenosis at C7-T1.     Electronically Signed   By: Marin Roberts M.D.   On: 08/04/2022 21:09  IMPRESSION  Brenda Mckinney is a 62 y.o. female who I performed a telephone encounter today for evaluation and management of: cervical radiculopathy    PLAN  Brenda Mckinney is a pleasant 42 y,o presenting via telephone visit for further evaluation of cervical radiculopathy. Despite time and conservative treatment she continued to have debilitating arm pain. We discussed her next options and she would like to consider ESIs. I will place a referral to Dr. Yves Dill for this. We will follow up with a couple of weeks after her injection to review her response. She was encouraged to call the office in the interim with any questions or concerns. She expressed understanding and was in agreement with this plan.   No orders of the defined types were placed in this encounter.  DISPOSITION  Follow up: In person appointment in  2-3 weeks after ESI  [name], PA   TELEPHONE DOCUMENTATION   This visit was performed via telephone.  Patient location: home Provider location: office  I spent a total of 5 minutes non-face-to-face activities for this visit on the date of this encounter including review of current clinical condition and response to treatment.  The patient is aware of and accepts the limits of this telehealth visit.

## 2022-09-10 ENCOUNTER — Other Ambulatory Visit: Payer: Self-pay | Admitting: Internal Medicine

## 2022-09-10 DIAGNOSIS — Z1231 Encounter for screening mammogram for malignant neoplasm of breast: Secondary | ICD-10-CM

## 2022-09-21 ENCOUNTER — Ambulatory Visit
Admission: RE | Admit: 2022-09-21 | Discharge: 2022-09-21 | Disposition: A | Discharge status: Home / Self Care | Payer: BC Managed Care – PPO | Source: Ambulatory Visit | Attending: Internal Medicine | Admitting: Internal Medicine

## 2022-09-21 DIAGNOSIS — Z1231 Encounter for screening mammogram for malignant neoplasm of breast: Secondary | ICD-10-CM | POA: Insufficient documentation

## 2022-10-11 ENCOUNTER — Ambulatory Visit (INDEPENDENT_AMBULATORY_CARE_PROVIDER_SITE_OTHER): Payer: BC Managed Care – PPO | Admitting: Neurosurgery

## 2022-10-11 DIAGNOSIS — M5412 Radiculopathy, cervical region: Secondary | ICD-10-CM

## 2022-10-11 NOTE — Progress Notes (Signed)
Neurosurgery Telephone (Audio-Only) Note  Requesting Provider     Gracelyn Nurse, MD [address] [place],  [place] [address] T: [phone] F: [phone]  Primary Care Provider Gracelyn Nurse, MD [address] [place] [address] T: [phone] F: [phone]  Telehealth visit was conducted with Misha Sherman, a 62 y.o. female via telephone.  History of Present Illness: Ms. Ringstad is a 62 y.o s/p C3-7 ACDF with persistent left arm pain. Updated MRI showed some ongoing compression. She has been undergoing dry needling and had an ESI with Dr. Yves Dill on 09/20/22 without any significant relief of her arm pain.   General Review of Systems:  A ROS was performed including pertinent positive and negatives as documented.  All other systems are negative.   Prior to Admission medications   Medication Sig Start Date End Date Taking? Authorizing Provider  acetaminophen (TYLENOL) 500 MG tablet Take 1,000 mg by mouth every 8 (eight) hours as needed.    [provider]  albuterol (PROVENTIL) (2.5 MG/3ML) 0.083% nebulizer solution Take 2.5 mg by nebulization every 6 (six) hours as needed for wheezing or shortness of breath.    [provider]  Albuterol Sulfate 108 (90 Base) MCG/ACT AEPB Inhale 2 puffs into the lungs every 4 (four) hours as needed (shortness of breath or wheezing).     [provider]  amLODipine (NORVASC) 5 MG tablet Take 5 mg by mouth daily.     [provider]  aspirin EC 81 MG tablet Take 81 mg by mouth daily.    [provider]  celecoxib (CELEBREX) 200 MG capsule Take 1 capsule (200 mg total) by mouth every 12 (twelve) hours. 07/24/22   [name], PA  fluticasone furoate-vilanterol (BREO ELLIPTA) 100-25 MCG/ACT AEPB Inhale 1 puff into the lungs daily as needed.    [provider]  gabapentin (NEURONTIN) 100 MG capsule Take 1 capsule (100 mg total) by mouth 3 (three)  times daily. 06/04/22   Venetia Night, MD  gabapentin (NEURONTIN) 300 MG capsule Take 1 capsule (300 mg total) by mouth at bedtime. 07/24/22   [name], PA  hydrochlorothiazide (HYDRODIURIL) 25 MG tablet Take 25 mg by mouth daily.    [provider]  levothyroxine (SYNTHROID, LEVOTHROID) 112 MCG tablet Take 112 mcg by mouth daily before breakfast.     [provider]  lisinopril (ZESTRIL) 40 MG tablet Take 40 mg by mouth daily.    [provider]  methocarbamol (ROBAXIN) 500 MG tablet Take 1 tablet (500 mg total) by mouth every 6 (six) hours as needed for muscle spasms. 05/11/22   [name], PA  metoprolol tartrate (LOPRESSOR) 25 MG tablet Take 25 mg by mouth 2 (two) times daily.     [provider]  montelukast (SINGULAIR) 10 MG tablet Take 10 mg by mouth at bedtime.    [provider]  potassium chloride (KLOR-CON) 10 MEQ tablet Take 10 mEq by mouth daily.    [provider]  predniSONE (DELTASONE) 5 MG tablet Take 5 mg by mouth daily with breakfast.    [provider]  simvastatin (ZOCOR) 40 MG tablet Take 40 mg by mouth at bedtime.    [provider]     IMPRESSION  Ms. Upshire-Tribble is a 62 y.o. female who I performed a telephone encounter today for evaluation and management of left sided cervical radiculopathy   PLAN  Ms. Upsjore-Tribble is a 62 y.o s/p C3-7 ACDF with  ongoing left sided radiculopathy that has failed to improve with conservative management. She is hesitant to undergo any additional surgery but would like to discuss her options further with Dr. Myer Haff. I encouraged her to keep her 9/24 office visit She will get her regularly scheduled xrays prior  No orders of the defined types were placed in this encounter.   DISPOSITION  Follow up: In person appointment in 2 weeks   [name], PA Neurosurgery  TELEPHONE DOCUMENTATION   This visit was performed via  telephone.  Patient location: home Provider location: office  I spent a total of 3 minutes non-face-to-face activities for this visit on the date of this encounter including review of current clinical condition and response to treatment.  The patient is aware of and accepts the limits of this telehealth visit.

## 2022-10-19 ENCOUNTER — Other Ambulatory Visit: Payer: Self-pay | Admitting: Family Medicine

## 2022-10-19 DIAGNOSIS — M5412 Radiculopathy, cervical region: Secondary | ICD-10-CM

## 2022-10-22 ENCOUNTER — Ambulatory Visit
Admission: RE | Admit: 2022-10-22 | Discharge: 2022-10-22 | Disposition: A | Discharge status: Home / Self Care | Payer: BC Managed Care – PPO | Source: Ambulatory Visit | Attending: Neurosurgery | Admitting: Neurosurgery

## 2022-10-22 ENCOUNTER — Ambulatory Visit
Admission: RE | Admit: 2022-10-22 | Discharge: 2022-10-22 | Disposition: A | Discharge status: Home / Self Care | Payer: BC Managed Care – PPO | Attending: Neurosurgery | Admitting: Neurosurgery

## 2022-10-22 DIAGNOSIS — M5412 Radiculopathy, cervical region: Secondary | ICD-10-CM | POA: Insufficient documentation

## 2022-10-23 ENCOUNTER — Ambulatory Visit (INDEPENDENT_AMBULATORY_CARE_PROVIDER_SITE_OTHER): Payer: BC Managed Care – PPO | Admitting: Neurosurgery

## 2022-10-23 ENCOUNTER — Encounter: Payer: Self-pay | Admitting: Neurosurgery

## 2022-10-23 VITALS — BP 125/76 | Ht 65.0 in | Wt 225.0 lb

## 2022-10-23 DIAGNOSIS — M5412 Radiculopathy, cervical region: Secondary | ICD-10-CM

## 2022-10-23 DIAGNOSIS — G959 Disease of spinal cord, unspecified: Secondary | ICD-10-CM

## 2022-10-23 MED ORDER — MELOXICAM 7.5 MG PO TABS: 7.5000 mg | ORAL_TABLET | Freq: Two times a day (BID) | ORAL | 0 refills | Status: DC

## 2022-10-23 MED ORDER — PREGABALIN 50 MG PO CAPS: 50.0000 mg | ORAL_CAPSULE | Freq: Three times a day (TID) | ORAL | 0 refills | Status: DC

## 2022-10-23 NOTE — Progress Notes (Signed)
Referring Physician:  No referring provider defined for this encounter.  Primary Physician:  Gracelyn Nurse, MD  DOS: 05/07/22 C3-7 ACDF   History of Present Illness: 10/23/2022  Brenda Mckinney has had a cervical epidural injection in August 2024.  This has helped a small amount.  She started Lyrica at night.  She was unable to tolerate gabapentin in the past.  Right now, her pain is around 3-4 out of 10.  Some days are worse than this.  Her strength in her left arm has improved, but is not at baseline.  She has some neck stiffness but no true pain in her neck.  Her pain in her left arm goes into her bicep and her forearm extending down to her thumb.  Her index finger on the left side is numb.  06/14/2022 Brenda Mckinney is status post anterior cervical discectomy and fusion for cervical myelopathy and left arm weakness.    She has been having severe pain around her anterior shoulder over the past 4 weeks.  She has trouble lifting her left shoulder up.  Otherwise, she is doing relatively well.   Her swelling has improved.  Her voice is strong.    03/27/2022 Brenda Mckinney is here today with a chief complaint of difficulty with use of her left arm as well as pain into her left arm.  She has left grip weakness and trouble opening jars and doing other things.  Her left hand feels heavy.  She has lost some dexterity in her left hand.  She has been dropping items as well.  She reports that she has had trouble with her balance.  Her husband reports that she sometimes veers off to the side when walking as though she is unsure of her balance.  This has been ongoing for a few weeks.   She recently went to the emergency department due to worsening of her symptoms.  She obtained imaging at that time. Bowel/Bladder Dysfunction: none  Conservative measures:  Physical therapy:  has not participated in Multimodal medical therapy including regular antiinflammatories:  tylenol, ibuprofen  Injections:  has not received epidural steroid injections  Past Surgery: denies  Brenda Mckinney has symptoms of cervical myelopathy.  The symptoms are causing a significant impact on the patient's life.   I have utilized the care everywhere function in epic to review the outside records available from external health systems.  Review of Systems:  A 10 point review of systems is negative, except for the pertinent positives and negatives detailed in the HPI.  Past Medical History: Past Medical History:  Diagnosis Date   Arthritis    Asthma    Bell's palsy    Cancer (HCC)    thyroid   Hyperlipemia    Hypertension    Hypothyroidism    Lung, hamartoma (HCC) 2017   Stroke [facility])    DATE UNKNOWN   Thyroid disease     Past Surgical History: Past Surgical History:  Procedure Laterality Date   ABDOMINAL HYSTERECTOMY  2001   ANTERIOR CERVICAL DECOMPRESSION/DISCECTOMY FUSION 4 LEVELS N/A 05/09/2022   Procedure: C3-7 ANTERIOR CERVICAL DISCECTOMY AND FUSION;  Surgeon: Venetia Night, MD;  Location: [facility] ORS;  Service: Neurosurgery;  Laterality: N/A;   CATARACT EXTRACTION W/ INTRAOCULAR LENS  IMPLANT, BILATERAL Bilateral 2020   COLONOSCOPY  09/10/2019   COLONOSCOPY  2011   TOTAL HIP ARTHROPLASTY Right 06/04/2019   Procedure: RIGHT TOTAL HIP ARTHROPLASTY ANTERIOR APPROACH;  Surgeon: Kennedy Bucker, MD;  Location: [facility]  ORS;  Service: Orthopedics;  Laterality: Right;   TOTAL THYROIDECTOMY  2011   TUBAL LIGATION  1987   VIDEO ASSISTED THORACOSCOPY (VATS)/THOROCOTOMY Right 07/25/2015   Procedure: RIGHT THOROCOSCOPY REMOVAL RIGHT LOWER LOBE MASS, PREOP BRONCHOSCOPY;  Surgeon: Hulda Marin, MD;  Location: [facility] ORS;  Service: General;  Laterality: Right;    Allergies: Allergies as of 10/23/2022   (No Known Allergies)    Medications: Current Meds  Medication Sig   acetaminophen (TYLENOL) 500 MG tablet Take 1,000 mg by mouth every 8 (eight) hours as  needed.   albuterol (PROVENTIL) (2.5 MG/3ML) 0.083% nebulizer solution Take 2.5 mg by nebulization every 6 (six) hours as needed for wheezing or shortness of breath.   Albuterol Sulfate 108 (90 Base) MCG/ACT AEPB Inhale 2 puffs into the lungs every 4 (four) hours as needed (shortness of breath or wheezing).    amLODipine (NORVASC) 5 MG tablet Take 5 mg by mouth daily.    aspirin EC 81 MG tablet Take 81 mg by mouth daily.   hydrochlorothiazide (HYDRODIURIL) 25 MG tablet Take 25 mg by mouth daily.   levothyroxine (SYNTHROID, LEVOTHROID) 112 MCG tablet Take 112 mcg by mouth daily before breakfast.    lisinopril (ZESTRIL) 40 MG tablet Take 40 mg by mouth daily.   meloxicam (MOBIC) 7.5 MG tablet Take 1 tablet (7.5 mg total) by mouth in the morning and at bedtime.   methocarbamol (ROBAXIN) 500 MG tablet Take 1 tablet (500 mg total) by mouth every 6 (six) hours as needed for muscle spasms.   metoprolol tartrate (LOPRESSOR) 25 MG tablet Take 25 mg by mouth 2 (two) times daily.    montelukast (SINGULAIR) 10 MG tablet Take 10 mg by mouth at bedtime.   potassium chloride (KLOR-CON) 10 MEQ tablet Take 10 mEq by mouth daily.   predniSONE (DELTASONE) 5 MG tablet Take 5 mg by mouth every other day.   pregabalin (LYRICA) 50 MG capsule Take 1 capsule (50 mg total) by mouth 3 (three) times daily.   simvastatin (ZOCOR) 40 MG tablet Take 40 mg by mouth at bedtime.   [DISCONTINUED] gabapentin (NEURONTIN) 100 MG capsule Take 1 capsule (100 mg total) by mouth 3 (three) times daily.    Social History: Social History   Tobacco Use   Smoking status: Never   Smokeless tobacco: Never  Vaping Use   Vaping status: Never Used  Substance Use Topics   Alcohol use: Yes    Comment: rare   Drug use: No    Family Medical History: Family History  Problem Relation Age of Onset   Breast cancer Paternal Aunt    Lymphoma Sister     Physical Examination: Vitals:   10/23/22 1034  BP: 125/76     General: Patient  is well developed, well nourished, calm, collected, and in no apparent distress. Attention to examination is appropriate.  Neck:   Supple.  Full range of motion.  Respiratory: Patient is breathing without any difficulty.   NEUROLOGICAL:     Awake, alert, oriented to person, place, and time.  Speech is clear and fluent.   Cranial Nerves: Pupils equal round and reactive to light.  Facial tone is symmetric.  Facial sensation is symmetric. Shoulder shrug is symmetric. Tongue protrusion is midline.  There is no pronator drift.  ROM of spine: full.    Strength: MAEW Has some hesitancy with lifting her left arm above the shoulder.  She has deltoid weakness on the left side.  She has numbness in her left  index finger.  Medical Decision Making  Imaging: MRI C spine 03/07/2022 Disc levels:   Foramen magnum is widely patent. Mild degenerative changes C1-2 but no stenosis.   C2-3: Minimal uncovertebral prominence.  No stenosis.   C3-4: Endplate osteophytes and bulging of the disc. Narrowing of the ventral subarachnoid space but no compression of cord. AP diameter of the canal in the midline 6.5 mm. There may be some cord atrophy at this level. Bilateral foraminal narrowing that could affect either C4 nerve.   C4-5: Spondylosis with endplate osteophytes and protruding disc material more prominent towards the left. Spinal stenosis with AP diameter of the canal in the midline 6.2 mm. Cord volume loss without abnormal T2 signal, particularly evident on the left side. Bilateral foraminal stenosis that could compress either C5 nerve.   C5-6: Spondylosis with endplate osteophytes and bulging disc material more prominent towards the right. Narrowing of the canal with AP diameter in the midline 7 mm. Flattening and myelomalacia on the right side of the cord at this level. Foraminal stenosis right worse than left. Either C6 nerve could be affected, more likely the right.   C6-7: Endplate  osteophytes and bulging of the disc. Narrowing of the canal, AP diameter in the midline 7.5 mm. Mild cord deformity. Bilateral foraminal stenosis that could affect either C7 nerve.   C7-T1: Uncovertebral prominence right worse than left. Bilateral facet degeneration. No compressive canal stenosis. Bilateral foraminal stenosis right worse than left could affect either C8 nerve, particularly the right.   IMPRESSION: 1. 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 Canal stenosis with cord deformity, most severe at C4-5 where AP diameter of the canal in the midline is 6.2 mm. Cord volume loss at C4-5 worse on the left than the right. Myelomalacia additionally on the right at C5-6. 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 2. Bilateral foraminal stenosis at C3-4, C4-5, C5-6, C6-7 and C7-T1 that could cause neural compression on either or both sides.     Electronically Signed   By: Paulina Fusi M.D.   On: 03/07/2022 18:30  MRI C spine 08/04/2022 Disc levels:   C2-3: Negative.   C3-4: Interval fusion present. Canal is slightly decompressed. Measures 7.5 mm maximally. Moderate foraminal narrowing is present bilaterally.   C4-5: Anterior fusion is present. The central canal is slightly decompressed, now measuring 7 mm minimally. Mild residual foraminal narrowing is present bilaterally.   C5-6: Anterior fusion is present. Canal is slightly decompressed a measuring 8 mm minimally. No distortion of the spinal cord remains. Moderate foraminal stenosis is similar the prior study, right greater than left.   C6-7: Interval fusion is present. Moderate residual foraminal narrowing is present bilaterally.   C7-T1: A rightward disc protrusion and uncovertebral spurring is stable. Moderate right foraminal stenosis is present.   IMPRESSION: 1. Interval ACDF at C3-4, C4-5, C5-6, and C6-7 with decompression of the central canal at these levels. 2. Moderate  foraminal narrowing bilaterally at C3-4, C4-5, C5-6, and C6-7. 3. Residual foraminal narrowing is greatest at C3-4 and C5-6. 4. Moderate right foraminal stenosis at C7-T1.     Electronically Signed   By: Marin Roberts M.D.   On: 08/04/2022 21:09  I have personally reviewed the images and agree with the above interpretation.  Assessment and Plan: Brenda Mckinney is a pleasant 62 y.o. female with cervical myelopathy and radiculopathy with some residual symptoms.  I feel that she likely has some persistent left-sided C5 radiculopathy.  She also has some symptoms of  left C6 radiculopathy.  It is possible that C7 is contributing, but I think C5 and C6 are the most likely culprits.  She is on a low-dose of pregabalin without effect at this point.  I would like to increase her dose to 50 mg 3 times daily.  We can increase from there if needed.  I will start her back on meloxicam.  This may help with her general aches and pains.  I will see her back in 6 weeks.  Will discuss the possibility of left-sided foraminotomies to help with her left arm discomfort.    I spent a total of 20 minutes in this patient's care today. This time was spent reviewing pertinent records including imaging studies, obtaining and confirming history, performing a directed evaluation, formulating and discussing my recommendations, and documenting the visit within the medical record.    Thank you for involving me in the care of this patient.      Makenzye Troutman K. Myer Haff MD, [facility]

## 2022-12-04 ENCOUNTER — Encounter: Payer: Self-pay | Admitting: Neurosurgery

## 2022-12-04 ENCOUNTER — Ambulatory Visit (INDEPENDENT_AMBULATORY_CARE_PROVIDER_SITE_OTHER): Payer: BC Managed Care – PPO | Admitting: Neurosurgery

## 2022-12-04 VITALS — BP 128/78 | Ht 65.0 in | Wt 225.0 lb

## 2022-12-04 DIAGNOSIS — M5412 Radiculopathy, cervical region: Secondary | ICD-10-CM

## 2022-12-04 DIAGNOSIS — G959 Disease of spinal cord, unspecified: Secondary | ICD-10-CM

## 2022-12-04 DIAGNOSIS — Z981 Arthrodesis status: Secondary | ICD-10-CM

## 2022-12-04 NOTE — Progress Notes (Signed)
Referring Physician:  Gracelyn Nurse, MD [id number] [facility] [address] [facility] [place],  [place] [address]  Primary Physician:  Gracelyn Nurse, MD  DOS: 05/07/22 C3-7 ACDF   History of Present Illness: 12/04/2022 She continues to have some pain down her arm.  Today it is approximately 5 out of 10.  Taking Lyrica 3 times a day caused her some fatigue and excessive sleepiness.  She is taking 1 to 2 pills/day.  10/23/2022 Brenda Mckinney has had a cervical epidural injection in August 2024.  This has helped a small amount.  She started Lyrica at night.  She was unable to tolerate gabapentin in the past.  Right now, her pain is around 3-4 out of 10.  Some days are worse than this.  Her strength in her left arm has improved, but is not at baseline.  She has some neck stiffness but no true pain in her neck.  Her pain in her left arm goes into her bicep and her forearm extending down to her thumb.  Her index finger on the left side is numb.  06/14/2022 Brenda Mckinney is status post anterior cervical discectomy and fusion for cervical myelopathy and left arm weakness.    She has been having severe pain around her anterior shoulder over the past 4 weeks.  She has trouble lifting her left shoulder up.  Otherwise, she is doing relatively well.   Her swelling has improved.  Her voice is strong.    03/27/2022 Brenda Mckinney is here today with a chief complaint of difficulty with use of her left arm as well as pain into her left arm.  She has left grip weakness and trouble opening jars and doing other things.  Her left hand feels heavy.  She has lost some dexterity in her left hand.  She has been dropping items as well.  She reports that she has had trouble with her balance.  Her husband reports that she sometimes veers off to the side when walking as though she is unsure of her balance.  This has been ongoing for a few weeks.   She recently went to the emergency department  due to worsening of her symptoms.  She obtained imaging at that time. Bowel/Bladder Dysfunction: none  Conservative measures:  Physical therapy:  has not participated in Multimodal medical therapy including regular antiinflammatories: tylenol, ibuprofen  Injections:  has not received epidural steroid injections  Past Surgery: denies  Brenda Mckinney has symptoms of cervical myelopathy.  The symptoms are causing a significant impact on the patient's life.   I have utilized the care everywhere function in epic to review the outside records available from external health systems.  Review of Systems:  A 10 point review of systems is negative, except for the pertinent positives and negatives detailed in the HPI.  Past Medical History: Past Medical History:  Diagnosis Date   Arthritis    Asthma    Bell's palsy    Cancer (HCC)    thyroid   Hyperlipemia    Hypertension    Hypothyroidism    Lung, hamartoma (HCC) 2017   Stroke [facility])    DATE UNKNOWN   Thyroid disease     Past Surgical History: Past Surgical History:  Procedure Laterality Date   ABDOMINAL HYSTERECTOMY  2001   ANTERIOR CERVICAL DECOMPRESSION/DISCECTOMY FUSION 4 LEVELS N/A 05/09/2022   Procedure: C3-7 ANTERIOR CERVICAL DISCECTOMY AND FUSION;  Surgeon: Venetia Night, MD;  Location: [facility] ORS;  Service: Neurosurgery;  Laterality: N/A;   CATARACT EXTRACTION W/ INTRAOCULAR LENS  IMPLANT, BILATERAL Bilateral 2020   COLONOSCOPY  09/10/2019   COLONOSCOPY  2011   TOTAL HIP ARTHROPLASTY Right 06/04/2019   Procedure: RIGHT TOTAL HIP ARTHROPLASTY ANTERIOR APPROACH;  Surgeon: Kennedy Bucker, MD;  Location: [facility] ORS;  Service: Orthopedics;  Laterality: Right;   TOTAL THYROIDECTOMY  2011   TUBAL LIGATION  1987   VIDEO ASSISTED THORACOSCOPY (VATS)/THOROCOTOMY Right 07/25/2015   Procedure: RIGHT THOROCOSCOPY REMOVAL RIGHT LOWER LOBE MASS, PREOP BRONCHOSCOPY;  Surgeon: Hulda Marin, MD;  Location: [facility] ORS;  Service:  General;  Laterality: Right;    Allergies: Allergies as of 12/04/2022   (No Known Allergies)    Medications: No outpatient medications have been marked as taking for the 12/04/22 encounter (Appointment) with Venetia Night, MD.    Social History: Social History   Tobacco Use   Smoking status: Never   Smokeless tobacco: Never  Vaping Use   Vaping status: Never Used  Substance Use Topics   Alcohol use: Yes    Comment: rare   Drug use: No    Family Medical History: Family History  Problem Relation Age of Onset   Breast cancer Paternal Aunt    Lymphoma Sister     Physical Examination: There were no vitals filed for this visit.    General: Patient is well developed, well nourished, calm, collected, and in no apparent distress. Attention to examination is appropriate.  Neck:   Supple.  Full range of motion.  Respiratory: Patient is breathing without any difficulty.   NEUROLOGICAL:     Awake, alert, oriented to person, place, and time.  Speech is clear and fluent.   Cranial Nerves: Pupils equal round and reactive to light.  Facial tone is symmetric.  Facial sensation is symmetric. Shoulder shrug is symmetric. Tongue protrusion is midline.  There is no pronator drift.  ROM of spine: full.    Strength: MAEW Has some hesitancy with lifting her left arm above the shoulder.  She has deltoid weakness on the left side.  She has numbness in her left index finger.  Medical Decision Making  Imaging: MRI C spine 03/07/2022 Disc levels:   Foramen magnum is widely patent. Mild degenerative changes C1-2 but no stenosis.   C2-3: Minimal uncovertebral prominence.  No stenosis.   C3-4: Endplate osteophytes and bulging of the disc. Narrowing of the ventral subarachnoid space but no compression of cord. AP diameter of the canal in the midline 6.5 mm. There may be some cord atrophy at this level. Bilateral foraminal narrowing that could affect either C4 nerve.   C4-5:  Spondylosis with endplate osteophytes and protruding disc material more prominent towards the left. Spinal stenosis with AP diameter of the canal in the midline 6.2 mm. Cord volume loss without abnormal T2 signal, particularly evident on the left side. Bilateral foraminal stenosis that could compress either C5 nerve.   C5-6: Spondylosis with endplate osteophytes and bulging disc material more prominent towards the right. Narrowing of the canal with AP diameter in the midline 7 mm. Flattening and myelomalacia on the right side of the cord at this level. Foraminal stenosis right worse than left. Either C6 nerve could be affected, more likely the right.   C6-7: Endplate osteophytes and bulging of the disc. Narrowing of the canal, AP diameter in the midline 7.5 mm. Mild cord deformity. Bilateral foraminal stenosis that could affect either C7 nerve.   C7-T1: Uncovertebral prominence right worse than left. Bilateral facet degeneration. No compressive  canal stenosis. Bilateral foraminal stenosis right worse than left could affect either C8 nerve, particularly the right.   IMPRESSION: 1. 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 Canal stenosis with cord deformity, most severe at C4-5 where AP diameter of the canal in the midline is 6.2 mm. Cord volume loss at C4-5 worse on the left than the right. Myelomalacia additionally on the right at C5-6. 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 2. Bilateral foraminal stenosis at C3-4, C4-5, C5-6, C6-7 and C7-T1 that could cause neural compression on either or both sides.     Electronically Signed   By: Paulina Fusi M.D.   On: 03/07/2022 18:30  MRI C spine 08/04/2022 Disc levels:   C2-3: Negative.   C3-4: Interval fusion present. Canal is slightly decompressed. Measures 7.5 mm maximally. Moderate foraminal narrowing is present bilaterally.   C4-5: Anterior fusion is present. The central canal is  slightly decompressed, now measuring 7 mm minimally. Mild residual foraminal narrowing is present bilaterally.   C5-6: Anterior fusion is present. Canal is slightly decompressed a measuring 8 mm minimally. No distortion of the spinal cord remains. Moderate foraminal stenosis is similar the prior study, right greater than left.   C6-7: Interval fusion is present. Moderate residual foraminal narrowing is present bilaterally.   C7-T1: A rightward disc protrusion and uncovertebral spurring is stable. Moderate right foraminal stenosis is present.   IMPRESSION: 1. Interval ACDF at C3-4, C4-5, C5-6, and C6-7 with decompression of the central canal at these levels. 2. Moderate foraminal narrowing bilaterally at C3-4, C4-5, C5-6, and C6-7. 3. Residual foraminal narrowing is greatest at C3-4 and C5-6. 4. Moderate right foraminal stenosis at C7-T1.     Electronically Signed   By: Marin Roberts M.D.   On: 08/04/2022 21:09  I have personally reviewed the images and agree with the above interpretation.  Assessment and Plan: Ms. Capano is a pleasant 62 y.o. female with cervical myelopathy and radiculopathy with some residual symptoms.  I feel that she likely has some persistent left-sided C5 radiculopathy.  She also has some symptoms of left C6 radiculopathy.  It is possible that C7 is contributing, but I think C5 and C6 are the most likely culprits.  At this point, she could consider posterior intervention.  If she decides to do that, we will need a CT scan to see whether she is fused.  That would determine whether she needs to have instrumentation.  She is not ready to take this step.  I will see her back in 2 months.  I spent a total of 20 minutes in this patient's care today. This time was spent reviewing pertinent records including imaging studies, obtaining and confirming history, performing a directed evaluation, formulating and discussing my recommendations, and  documenting the visit within the medical record.    Thank you for involving me in the care of this patient.      Rindi Beechy K. Myer Haff MD, [facility] Neurosurgery

## 2023-02-05 ENCOUNTER — Ambulatory Visit: Payer: BC Managed Care – PPO | Admitting: Neurosurgery

## 2023-03-01 ENCOUNTER — Other Ambulatory Visit: Payer: Self-pay | Admitting: Neurosurgery

## 2023-03-15 ENCOUNTER — Other Ambulatory Visit: Payer: Self-pay | Admitting: Neurosurgery

## 2023-04-29 ENCOUNTER — Other Ambulatory Visit: Payer: Self-pay

## 2023-04-29 DIAGNOSIS — M5412 Radiculopathy, cervical region: Secondary | ICD-10-CM

## 2023-04-30 ENCOUNTER — Telehealth: Payer: Self-pay | Admitting: Neurosurgery

## 2023-04-30 ENCOUNTER — Ambulatory Visit: Admitting: Neurosurgery

## 2023-04-30 VITALS — BP 132/82 | Ht 65.0 in | Wt 225.0 lb

## 2023-04-30 DIAGNOSIS — M5416 Radiculopathy, lumbar region: Secondary | ICD-10-CM

## 2023-04-30 DIAGNOSIS — M5412 Radiculopathy, cervical region: Secondary | ICD-10-CM

## 2023-04-30 DIAGNOSIS — G959 Disease of spinal cord, unspecified: Secondary | ICD-10-CM

## 2023-04-30 MED ORDER — MELOXICAM 7.5 MG PO TABS: 7.5000 mg | ORAL_TABLET | Freq: Every day | ORAL | 0 refills | Status: DC

## 2023-04-30 NOTE — Telephone Encounter (Signed)
 Patient is calling to obtain clarification on the prescription Danielle sent in for her. She states that previously she was taking two Meloxicam tablets daily and the new prescription is only for one tablet. Should she continue taking two or does Danielle want her to just take one? Please advise.

## 2023-04-30 NOTE — Progress Notes (Signed)
 Referring Physician:  No referring provider defined for this encounter.  Primary Physician:  Brenda Nurse, MD  DOS: 05/07/22 C3-7 ACDF  History of Present Illness: 04/30/2023 Brenda Mckinney presents today for further evaluation of ongoing pain. She continues to have neck and left arm pain unchanged from previous. She reports 2 weeks of circumferential left leg pain into the lower leg.  She states that she has some pain into her left groin and around her left knee but also endorses low back pain and pain that radiates to into her leg.  She cannot isolate it would in the anterior posterior aspect of her leg.  She denies any pain, numbness, or tingling into her left foot.    12/04/2022 She continues to have some pain down her arm.  Today it is approximately 5 out of 10.  Taking Lyrica 3 times a day caused her some fatigue and excessive sleepiness.  She is taking 1 to 2 pills/day.  10/23/2022 [name] has had a cervical epidural injection in August 2024.  This has helped a small amount.  She started Lyrica at night.  She was unable to tolerate gabapentin in the past.  Right now, her pain is around 3-4 out of 10.  Some days are worse than this.  Her strength in her left arm has improved, but is not at baseline.  She has some neck stiffness but no true pain in her neck.  Her pain in her left arm goes into her bicep and her forearm extending down to her thumb.  Her index finger on the left side is numb.  06/14/2022 Brenda Mckinney is status post anterior cervical discectomy and fusion for cervical myelopathy and left arm weakness.    She has been having severe pain around her anterior shoulder over the past 4 weeks.  She has trouble lifting her left shoulder up.  Otherwise, she is doing relatively well.   Her swelling has improved.  Her voice is strong.    03/27/2022 Ms. Brenda Mckinney is here today with a chief complaint of difficulty with use of her left  arm as well as pain into her left arm.  She has left grip weakness and trouble opening jars and doing other things.  Her left hand feels heavy.  She has lost some dexterity in her left hand.  She has been dropping items as well.  She reports that she has had trouble with her balance.  Her husband reports that she sometimes veers off to the side when walking as though she is unsure of her balance.  This has been ongoing for a few weeks.   She recently went to the emergency department due to worsening of her symptoms.  She obtained imaging at that time. Bowel/Bladder Dysfunction: none  Conservative measures:  Physical therapy:  has not participated in Multimodal medical therapy including regular antiinflammatories: tylenol, ibuprofen  Injections:  has not received epidural steroid injections  Past Surgery: denies  Brenda Mckinney has symptoms of cervical myelopathy.  The symptoms are causing a significant impact on the patient's life.   I have utilized the care everywhere function in epic to review the outside records available from external health systems.  Review of Systems:  A 10 point review of systems is negative, except for the pertinent positives and negatives detailed in the HPI.  Past Medical History: Past Medical History:  Diagnosis Date   Arthritis    Asthma    Bell's palsy    Cancer (  HCC)    thyroid   Hyperlipemia    Hypertension    Hypothyroidism    Lung, hamartoma (HCC) 2017   Stroke [facility])    DATE UNKNOWN   Thyroid disease     Past Surgical History: Past Surgical History:  Procedure Laterality Date   ABDOMINAL HYSTERECTOMY  2001   ANTERIOR CERVICAL DECOMPRESSION/DISCECTOMY FUSION 4 LEVELS N/A 05/09/2022   Procedure: C3-7 ANTERIOR CERVICAL DISCECTOMY AND FUSION;  Surgeon: Venetia Night, MD;  Location: [facility] ORS;  Service: Neurosurgery;  Laterality: N/A;   CATARACT EXTRACTION W/ INTRAOCULAR LENS  IMPLANT, BILATERAL Bilateral 2020   COLONOSCOPY   09/10/2019   COLONOSCOPY  2011   TOTAL HIP ARTHROPLASTY Right 06/04/2019   Procedure: RIGHT TOTAL HIP ARTHROPLASTY ANTERIOR APPROACH;  Surgeon: Kennedy Bucker, MD;  Location: [facility] ORS;  Service: Orthopedics;  Laterality: Right;   TOTAL THYROIDECTOMY  2011   TUBAL LIGATION  1987   VIDEO ASSISTED THORACOSCOPY (VATS)/THOROCOTOMY Right 07/25/2015   Procedure: RIGHT THOROCOSCOPY REMOVAL RIGHT LOWER LOBE MASS, PREOP BRONCHOSCOPY;  Surgeon: Hulda Marin, MD;  Location: [facility] ORS;  Service: General;  Laterality: Right;    Allergies: Allergies as of 04/30/2023   (No Known Allergies)    Medications: No outpatient medications have been marked as taking for the 04/30/23 encounter (Appointment) with [name], PA.    Social History: Social History   Tobacco Use   Smoking status: Never   Smokeless tobacco: Never  Vaping Use   Vaping status: Never Used  Substance Use Topics   Alcohol use: Yes    Comment: rare   Drug use: No    Family Medical History: Family History  Problem Relation Age of Onset   Breast cancer Paternal Aunt    Lymphoma Sister     Physical Examination: There were no vitals filed for this visit.  General: Patient is well developed, well nourished, calm, collected, and in no apparent distress. Attention to examination is appropriate.  Neck:   Supple.  Full range of motion.  Respiratory: Patient is breathing without any difficulty.   NEUROLOGICAL:     Awake, alert, oriented to person, place, and time.  Speech is clear and fluent.   Cranial Nerves: Pupils equal round and reactive to light.  Facial tone is symmetric.  Facial sensation is symmetric. Shoulder shrug is symmetric. Tongue protrusion is midline.  There is no pronator drift.  ROM of spine: full.    Strength: MAEW Has some hesitancy with lifting her left arm above the shoulder.  She has deltoid weakness on the left side.  She has numbness in her left index finger.  Medical Decision  Making  Imaging: MRI C spine 03/07/2022 Disc levels:   Foramen magnum is widely patent. Mild degenerative changes C1-2 but no stenosis.   C2-3: Minimal uncovertebral prominence.  No stenosis.   C3-4: Endplate osteophytes and bulging of the disc. Narrowing of the ventral subarachnoid space but no compression of cord. AP diameter of the canal in the midline 6.5 mm. There may be some cord atrophy at this level. Bilateral foraminal narrowing that could affect either C4 nerve.   C4-5: Spondylosis with endplate osteophytes and protruding disc material more prominent towards the left. Spinal stenosis with AP diameter of the canal in the midline 6.2 mm. Cord volume loss without abnormal T2 signal, particularly evident on the left side. Bilateral foraminal stenosis that could compress either C5 nerve.   C5-6: Spondylosis with endplate osteophytes and bulging disc material more prominent towards the right. Narrowing  of the canal with AP diameter in the midline 7 mm. Flattening and myelomalacia on the right side of the cord at this level. Foraminal stenosis right worse than left. Either C6 nerve could be affected, more likely the right.   C6-7: Endplate osteophytes and bulging of the disc. Narrowing of the canal, AP diameter in the midline 7.5 mm. Mild cord deformity. Bilateral foraminal stenosis that could affect either C7 nerve.   C7-T1: Uncovertebral prominence right worse than left. Bilateral facet degeneration. No compressive canal stenosis. Bilateral foraminal stenosis right worse than left could affect either C8 nerve, particularly the right.   IMPRESSION: 1. 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 Canal stenosis with cord deformity, most severe at C4-5 where AP diameter of the canal in the midline is 6.2 mm. Cord volume loss at C4-5 worse on the left than the right. Myelomalacia additionally on the right at C5-6. 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 2. Bilateral foraminal stenosis at C3-4, C4-5, C5-6, C6-7 and C7-T1 that could cause neural compression on either or both sides.     Electronically Signed   By: Paulina Fusi M.D.   On: 03/07/2022 18:30  MRI C spine 08/04/2022 Disc levels:   C2-3: Negative.   C3-4: Interval fusion present. Canal is slightly decompressed. Measures 7.5 mm maximally. Moderate foraminal narrowing is present bilaterally.   C4-5: Anterior fusion is present. The central canal is slightly decompressed, now measuring 7 mm minimally. Mild residual foraminal narrowing is present bilaterally.   C5-6: Anterior fusion is present. Canal is slightly decompressed a measuring 8 mm minimally. No distortion of the spinal cord remains. Moderate foraminal stenosis is similar the prior study, right greater than left.   C6-7: Interval fusion is present. Moderate residual foraminal narrowing is present bilaterally.   C7-T1: A rightward disc protrusion and uncovertebral spurring is stable. Moderate right foraminal stenosis is present.   IMPRESSION: 1. Interval ACDF at C3-4, C4-5, C5-6, and C6-7 with decompression of the central canal at these levels. 2. Moderate foraminal narrowing bilaterally at C3-4, C4-5, C5-6, and C6-7. 3. Residual foraminal narrowing is greatest at C3-4 and C5-6. 4. Moderate right foraminal stenosis at C7-T1.     Electronically Signed   By: Marin Roberts M.D.   On: 08/04/2022 21:09  I have personally reviewed the images and agree with the above interpretation.  Assessment and Plan: Brenda Mckinney is a pleasant 63 y.o. female with cervical myelopathy and radiculopathy with some residual symptoms.  I recommended moving forward with a cervical CT scan for further evaluation given her ongoing cervical symptoms as well as cervical 5.  In regards to her lumbar symptoms, I recommended lumbar MRI.  Should this be largely normal we will have her seen by  orthopedics to evaluate her hip and knee.  In the meantime I have provided her with a prescription of meloxicam as a refill per her request.  I will review her imaging with Dr. Myer Haff and send her a message regarding next steps.  She was encouraged to call the office in the interim with any questions or concerns.  She expressed understanding and was in agreement with this plan.  I spent a total of 30 minutes in both face-to-face and non-face-to-face activities for this visit on the date of this encounter.   Manning Charity PA-C Neurosurgery

## 2023-05-01 NOTE — Telephone Encounter (Signed)
 Patient picked up medication. She will take medication BID and call us a few days in advance to have refill with change to Mobic BID

## 2023-05-07 ENCOUNTER — Ambulatory Visit
Admission: RE | Admit: 2023-05-07 | Discharge: 2023-05-07 | Disposition: A | Discharge status: Home / Self Care | Source: Ambulatory Visit | Attending: Neurosurgery | Admitting: Neurosurgery

## 2023-05-07 DIAGNOSIS — M5416 Radiculopathy, lumbar region: Secondary | ICD-10-CM

## 2023-05-07 DIAGNOSIS — M5412 Radiculopathy, cervical region: Secondary | ICD-10-CM | POA: Insufficient documentation

## 2023-05-13 ENCOUNTER — Other Ambulatory Visit: Payer: Self-pay

## 2023-05-13 MED ORDER — MELOXICAM 7.5 MG PO TABS: 7.5000 mg | ORAL_TABLET | Freq: Every day | ORAL | 0 refills | Status: DC

## 2023-06-13 ENCOUNTER — Ambulatory Visit: Admitting: Neurosurgery

## 2023-06-13 ENCOUNTER — Encounter: Payer: Self-pay | Admitting: Neurosurgery

## 2023-06-13 VITALS — BP 136/88 | Ht 65.0 in | Wt 225.0 lb

## 2023-06-13 DIAGNOSIS — Z981 Arthrodesis status: Secondary | ICD-10-CM

## 2023-06-13 DIAGNOSIS — M48062 Spinal stenosis, lumbar region with neurogenic claudication: Secondary | ICD-10-CM

## 2023-06-13 DIAGNOSIS — M25552 Pain in left hip: Secondary | ICD-10-CM

## 2023-06-13 DIAGNOSIS — M5412 Radiculopathy, cervical region: Secondary | ICD-10-CM | POA: Diagnosis not present

## 2023-06-13 MED ORDER — PREGABALIN 50 MG PO CAPS: 50.0000 mg | ORAL_CAPSULE | Freq: Three times a day (TID) | ORAL | 0 refills | Status: DC

## 2023-06-13 MED ORDER — MELOXICAM 15 MG PO TABS
7.5000 mg | ORAL_TABLET | Freq: Every day | ORAL | 0 refills | Status: DC
Start: 2023-06-13 — End: 2023-09-13

## 2023-06-13 NOTE — Progress Notes (Signed)
 Referring Physician:  Little Riff, MD [address] [address] [facility] [place],  [place] [address]  Primary Physician:  Little Riff, MD  DOS: 05/07/22 C3-7 ACDF  History of Present Illness: 06/13/2023 Mrs. Brenda Mckinney presents for follow-up.  She restarted her meloxicam  which has significantly helped her.  She still having left arm pain as well as pain around her left hip and thigh.  She is also having back pain.  04/30/2023 (Danielle Koch's note): Ms. Brenda Mckinney presents today for further evaluation of ongoing pain. She continues to have neck and left arm pain unchanged from previous. She reports 2 weeks of circumferential left leg pain into the lower leg.  She states that she has some pain into her left groin and around her left knee but also endorses low back pain and pain that radiates to into her leg.  She cannot isolate it would in the anterior posterior aspect of her leg.  She denies any pain, numbness, or tingling into her left foot.    12/04/2022 She continues to have some pain down her arm.  Today it is approximately 5 out of 10.  Taking Lyrica  3 times a day caused her some fatigue and excessive sleepiness.  She is taking 1 to 2 pills/day.  10/23/2022 Mrs. Brenda Mckinney has had a cervical epidural injection in August 2024.  This has helped a small amount.  She started Lyrica  at night.  She was unable to tolerate gabapentin  in the past.  Right now, her pain is around 3-4 out of 10.  Some days are worse than this.  Her strength in her left arm has improved, but is not at baseline.  She has some neck stiffness but no true pain in her neck.  Her pain in her left arm goes into her bicep and her forearm extending down to her thumb.  Her index finger on the left side is numb.  06/14/2022 [name] is status post anterior cervical discectomy and fusion for cervical myelopathy and left arm weakness.    She has been having severe pain around her anterior  shoulder over the past 4 weeks.  She has trouble lifting her left shoulder up.  Otherwise, she is doing relatively well.   Her swelling has improved.  Her voice is strong.    03/27/2022 Ms. [name] is here today with a chief complaint of difficulty with use of her left arm as well as pain into her left arm.  She has left grip weakness and trouble opening jars and doing other things.  Her left hand feels heavy.  She has lost some dexterity in her left hand.  She has been dropping items as well.  She reports that she has had trouble with her balance.  Her husband reports that she sometimes veers off to the side when walking as though she is unsure of her balance.  This has been ongoing for a few weeks.   She recently went to the emergency department due to worsening of her symptoms.  She obtained imaging at that time. Bowel/Bladder Dysfunction: none  Conservative measures:  Physical therapy:  has not participated in Multimodal medical therapy including regular antiinflammatories: tylenol , ibuprofen  Injections:  has not received epidural steroid injections  Past Surgery: denies  Brenda Mckinney has symptoms of cervical myelopathy.  The symptoms are causing a significant impact on the patient's life.   I have utilized the care everywhere function in epic to review the outside records available from external health  systems.  Review of Systems:  A 10 point review of systems is negative, except for the pertinent positives and negatives detailed in the HPI.  Past Medical History: Past Medical History:  Diagnosis Date   Arthritis    Asthma    Bell's palsy    Cancer (HCC)    thyroid   Hyperlipemia    Hypertension    Hypothyroidism    Lung, hamartoma (HCC) 2017   Stroke [facility])    DATE UNKNOWN   Thyroid disease     Past Surgical History: Past Surgical History:  Procedure Laterality Date   ABDOMINAL HYSTERECTOMY  2001   ANTERIOR CERVICAL  DECOMPRESSION/DISCECTOMY FUSION 4 LEVELS N/A 05/09/2022   Procedure: C3-7 ANTERIOR CERVICAL DISCECTOMY AND FUSION;  Surgeon: Jodeen Munch, MD;  Location: [facility] ORS;  Service: Neurosurgery;  Laterality: N/A;   CATARACT EXTRACTION W/ INTRAOCULAR LENS  IMPLANT, BILATERAL Bilateral 2020   COLONOSCOPY  09/10/2019   COLONOSCOPY  2011   TOTAL HIP ARTHROPLASTY Right 06/04/2019   Procedure: RIGHT TOTAL HIP ARTHROPLASTY ANTERIOR APPROACH;  Surgeon: Molli Angelucci, MD;  Location: [facility] ORS;  Service: Orthopedics;  Laterality: Right;   TOTAL THYROIDECTOMY  2011   TUBAL LIGATION  1987   VIDEO ASSISTED THORACOSCOPY (VATS)/THOROCOTOMY Right 07/25/2015   Procedure: RIGHT THOROCOSCOPY REMOVAL RIGHT LOWER LOBE MASS, PREOP BRONCHOSCOPY;  Surgeon: Petra Brandy, MD;  Location: [facility] ORS;  Service: General;  Laterality: Right;    Allergies: Allergies as of 06/13/2023   (No Known Allergies)    Medications: Current Meds  Medication Sig   acetaminophen  (TYLENOL ) 500 MG tablet Take 1,000 mg by mouth every 8 (eight) hours as needed.   albuterol  (PROVENTIL ) (2.5 MG/3ML) 0.083% nebulizer solution Take 2.5 mg by nebulization every 6 (six) hours as needed for wheezing or shortness of breath.   Albuterol  Sulfate 108 (90 Base) MCG/ACT AEPB Inhale 2 puffs into the lungs every 4 (four) hours as needed (shortness of breath or wheezing).    amLODipine  (NORVASC ) 5 MG tablet Take 5 mg by mouth daily.    aspirin  EC 81 MG tablet Take 81 mg by mouth daily.   hydrochlorothiazide  (HYDRODIURIL ) 25 MG tablet Take 25 mg by mouth daily.   levothyroxine  (SYNTHROID , LEVOTHROID) 112 MCG tablet Take 112 mcg by mouth daily before breakfast.    lisinopril  (ZESTRIL ) 40 MG tablet Take 40 mg by mouth daily.   meloxicam  (MOBIC ) 7.5 MG tablet Take 1 tablet (7.5 mg total) by mouth daily.   metoprolol  tartrate (LOPRESSOR ) 25 MG tablet Take 25 mg by mouth 2 (two) times daily.    montelukast  (SINGULAIR ) 10 MG tablet Take 10 mg by mouth at  bedtime.   potassium chloride  (KLOR-CON ) 10 MEQ tablet Take 10 mEq by mouth daily.   pregabalin  (LYRICA ) 50 MG capsule Take 1 capsule (50 mg total) by mouth 3 (three) times daily.   simvastatin  (ZOCOR ) 40 MG tablet Take 40 mg by mouth at bedtime.    Social History: Social History   Tobacco Use   Smoking status: Never   Smokeless tobacco: Never  Vaping Use   Vaping status: Never Used  Substance Use Topics   Alcohol use: Yes    Comment: rare   Drug use: No    Family Medical History: Family History  Problem Relation Age of Onset   Breast cancer Paternal Aunt    Lymphoma Sister     Physical Examination: Vitals:   06/13/23 1140  BP: 136/88    General: Patient is well developed, well nourished, calm,  collected, and in no apparent distress. Attention to examination is appropriate.  Neck:   Supple.  Full range of motion.  Respiratory: Patient is breathing without any difficulty.   NEUROLOGICAL:     Awake, alert, oriented to person, place, and time.  Speech is clear and fluent.   Cranial Nerves: Pupils equal round and reactive to light.  Facial tone is symmetric.  Facial sensation is symmetric. Shoulder shrug is symmetric. Tongue protrusion is midline.  There is no pronator drift.  ROM of spine: full.    Strength: MAEW Has some hesitancy with lifting her left arm above the shoulder.  She has deltoid weakness on the left side.  She has numbness in her left index finger.  Medical Decision Making  Imaging: MRI C spine 03/07/2022 Disc levels:   Foramen magnum is widely patent. Mild degenerative changes C1-2 but no stenosis.   C2-3: Minimal uncovertebral prominence.  No stenosis.   C3-4: Endplate osteophytes and bulging of the disc. Narrowing of the ventral subarachnoid space but no compression of cord. AP diameter of the canal in the midline 6.5 mm. There may be some cord atrophy at this level. Bilateral foraminal narrowing that could affect either C4 nerve.    C4-5: Spondylosis with endplate osteophytes and protruding disc material more prominent towards the left. Spinal stenosis with AP diameter of the canal in the midline 6.2 mm. Cord volume loss without abnormal T2 signal, particularly evident on the left side. Bilateral foraminal stenosis that could compress either C5 nerve.   C5-6: Spondylosis with endplate osteophytes and bulging disc material more prominent towards the right. Narrowing of the canal with AP diameter in the midline 7 mm. Flattening and myelomalacia on the right side of the cord at this level. Foraminal stenosis right worse than left. Either C6 nerve could be affected, more likely the right.   C6-7: Endplate osteophytes and bulging of the disc. Narrowing of the canal, AP diameter in the midline 7.5 mm. Mild cord deformity. Bilateral foraminal stenosis that could affect either C7 nerve.   C7-T1: Uncovertebral prominence right worse than left. Bilateral facet degeneration. No compressive canal stenosis. Bilateral foraminal stenosis right worse than left could affect either C8 nerve, particularly the right.   IMPRESSION: 1. 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 Canal stenosis with cord deformity, most severe at C4-5 where AP diameter of the canal in the midline is 6.2 mm. Cord volume loss at C4-5 worse on the left than the right. Myelomalacia additionally on the right at C5-6. 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 2. Bilateral foraminal stenosis at C3-4, C4-5, C5-6, C6-7 and C7-T1 that could cause neural compression on either or both sides.     Electronically Signed   By: Bettylou Brunner M.D.   On: 03/07/2022 18:30  MRI C spine 08/04/2022 Disc levels:   C2-3: Negative.   C3-4: Interval fusion present. Canal is slightly decompressed. Measures 7.5 mm maximally. Moderate foraminal narrowing is present bilaterally.   C4-5: Anterior fusion is present. The central canal is  slightly decompressed, now measuring 7 mm minimally. Mild residual foraminal narrowing is present bilaterally.   C5-6: Anterior fusion is present. Canal is slightly decompressed a measuring 8 mm minimally. No distortion of the spinal cord remains. Moderate foraminal stenosis is similar the prior study, right greater than left.   C6-7: Interval fusion is present. Moderate residual foraminal narrowing is present bilaterally.   C7-T1: A rightward disc protrusion and uncovertebral spurring is stable. Moderate right foraminal stenosis  is present.   IMPRESSION: 1. Interval ACDF at C3-4, C4-5, C5-6, and C6-7 with decompression of the central canal at these levels. 2. Moderate foraminal narrowing bilaterally at C3-4, C4-5, C5-6, and C6-7. 3. Residual foraminal narrowing is greatest at C3-4 and C5-6. 4. Moderate right foraminal stenosis at C7-T1.     Electronically Signed   By: Audree Leas M.D.   On: 08/04/2022 21:09  MR C spine 05/07/2023 Disc levels:   C2-C3: The disk is normal in configuration. No facet arthropathy. Moderate bilateral uncovertebral joint disease. Moderate bilateral neuroforaminal stenosis. No spinal canal stenosis.   C3-C4: Disc osteophyte complex with anterior fusion. No facet arthropathy. Moderate bilateral uncovertebral joint disease. Moderate bilateral neuroforaminal stenosis. Mild spinal canal stenosis.   C4-C5: Disc osteophyte complex with anterior fusion. Mild bilateral facet arthropathy. Severe bilateral uncovertebral joint disease. Severe bilateral neuroforaminal stenosis. Mild spinal canal stenosis.   C5-C6: Disc osteophyte complexes anterior fusion. Mild bilateral facet arthropathy. Severe bilateral uncovertebral joint disease. Severe bilateral neuroforaminal stenosis. Mild spinal canal stenosis.   C6-C7: Disc osteophyte complex. Mild bilateral facet arthropathy. Moderate left and mild right uncovertebral joint disease. Moderate left  and mild right neuroforaminal stenosis. No spinal canal stenosis.   C7-T1: Disc osteophyte complex. Mild bilateral facet arthropathy. Severe bilateral uncovertebral joint disease. Severe bilateral neuroforaminal stenosis. No spinal canal stenosis.   IMPRESSION: 1. Anterior cervical fusion from C3 through C7. 2. Severe foraminal stenoses bilaterally at C4-C5, C5-C6, and C7-T1 secondary to uncovertebral joint disease and facet arthropathy. Moderate foraminal stenoses at other levels.     Electronically Signed   By: Johnanna Mylar M.D.   On: 05/30/2023 10:18  CT C spine 05/07/2023 IMPRESSION: *Status post ACDF C3 down to C7. *No significant central canal narrowing. *No significant foraminal narrowing.     Electronically Signed   By: Fredrich Jefferson M.D.   On: 05/17/2023 16:32  MRI L spine 05/07/2023 IMPRESSION: 1. Severe canal stenoses at L3-L4 and L4-L5 secondary to disc bulging and facet arthropathy superimposed on prominent dorsal epidural fat. 2. Moderate foraminal stenosis bilaterally at L3-L4 and L4-L5.     Electronically Signed   By: Johnanna Mylar M.D.   On: 05/30/2023 10:34  I have personally reviewed the images and agree with the above interpretation.  Assessment and Plan: Ms. Obeid is a pleasant 63 y.o. female with cervical myelopathy and radiculopathy with some residual symptoms.  She does have some residual foraminal stenosis.  Her symptoms are somewhat better right now.  Will increase her meloxicam .  She will continue her Lyrica .  I will send her for physical therapy.  For her back and hip, I will also start some physical therapy.  I will see her back in approximately 3 months.  I spent a total of 15 minutes in both face-to-face and non-face-to-face activities for this visit on the date of this encounter.   Jodeen Munch MD Neurosurgery

## 2023-08-14 ENCOUNTER — Telehealth: Payer: Self-pay

## 2023-08-14 MED ORDER — PREGABALIN 50 MG PO CAPS: 50.0000 mg | ORAL_CAPSULE | Freq: Three times a day (TID) | ORAL | 1 refills | Status: DC

## 2023-08-14 NOTE — Telephone Encounter (Signed)
 She has f/u with [facility] on 09/12/23.   PMP reviewed and is appropriate.   Lyrica  refill sent to pharmacy. Please let her know.

## 2023-08-14 NOTE — Telephone Encounter (Signed)
 Fax received from [place] requesting a refill on Lyrica  50mg , take 1 by mouth 3 times daily.

## 2023-08-15 NOTE — Telephone Encounter (Signed)
Left detailed message regarding refill.

## 2023-09-11 ENCOUNTER — Other Ambulatory Visit: Payer: Self-pay | Admitting: Internal Medicine

## 2023-09-11 DIAGNOSIS — Z1231 Encounter for screening mammogram for malignant neoplasm of breast: Secondary | ICD-10-CM

## 2023-09-12 ENCOUNTER — Encounter: Payer: Self-pay | Admitting: Neurosurgery

## 2023-09-12 ENCOUNTER — Other Ambulatory Visit: Payer: Self-pay | Admitting: Neurosurgery

## 2023-09-12 ENCOUNTER — Ambulatory Visit: Admitting: Neurosurgery

## 2023-09-12 VITALS — BP 130/84 | Ht 65.0 in | Wt 216.2 lb

## 2023-09-12 DIAGNOSIS — G959 Disease of spinal cord, unspecified: Secondary | ICD-10-CM | POA: Diagnosis not present

## 2023-09-12 DIAGNOSIS — M5412 Radiculopathy, cervical region: Secondary | ICD-10-CM | POA: Diagnosis not present

## 2023-09-12 DIAGNOSIS — M4802 Spinal stenosis, cervical region: Secondary | ICD-10-CM

## 2023-09-12 DIAGNOSIS — M25552 Pain in left hip: Secondary | ICD-10-CM

## 2023-09-12 NOTE — Progress Notes (Signed)
 Referring Physician:  Rudolpho Norleen BIRCH, MD [address] [facility] [address],  [place] [address]  Primary Physician:  Rudolpho Norleen BIRCH, MD  DOS: 05/07/22 C3-7 ACDF  History of Present Illness: 09/12/2023 She has been doing physical therapy for a frozen shoulder.  It is helping but is not completely improved.  She continues to have discomfort in her left arm.   06/13/2023 Brenda Mckinney presents for follow-up.  She restarted her meloxicam  which has significantly helped her.  She still having left arm pain as well as pain around her left hip and thigh.  She is also having back pain.  04/30/2023 (Danielle Koch's note): Brenda Mckinney presents today for further evaluation of ongoing pain. She continues to have neck and left arm pain unchanged from previous. She reports 2 weeks of circumferential left leg pain into the lower leg.  She states that she has some pain into her left groin and around her left knee but also endorses low back pain and pain that radiates to into her leg.  She cannot isolate it would in the anterior posterior aspect of her leg.  She denies any pain, numbness, or tingling into her left foot.    12/04/2022 She continues to have some pain down her arm.  Today it is approximately 5 out of 10.  Taking Lyrica  3 times a day caused her some fatigue and excessive sleepiness.  She is taking 1 to 2 pills/day.  10/23/2022 Brenda Mckinney has had a cervical epidural injection in August 2024.  This has helped a small amount.  She started Lyrica  at night.  She was unable to tolerate gabapentin  in the past.  Right now, her pain is around 3-4 out of 10.  Some days are worse than this.  Her strength in her left arm has improved, but is not at baseline.  She has some neck stiffness but no true pain in her neck.  Her pain in her left arm goes into her bicep and her forearm extending down to her thumb.  Her index finger on the left side is numb.  06/14/2022 Brenda Mckinney is status post anterior cervical discectomy and fusion for cervical myelopathy and left arm weakness.    She has been having severe pain around her anterior shoulder over the past 4 weeks.  She has trouble lifting her left shoulder up.  Otherwise, she is doing relatively well.   Her swelling has improved.  Her voice is strong.    03/27/2022 Brenda Mckinney is here today with a chief complaint of difficulty with use of her left arm as well as pain into her left arm.  She has left grip weakness and trouble opening jars and doing other things.  Her left hand feels heavy.  She has lost some dexterity in her left hand.  She has been dropping items as well.  She reports that she has had trouble with her balance.  Her husband reports that she sometimes veers off to the side when walking as though she is unsure of her balance.  This has been ongoing for a few weeks.   She recently went to the emergency department due to worsening of her symptoms.  She obtained imaging at that time. Bowel/Bladder Dysfunction: none  Conservative measures:  Physical therapy:  has not participated in Multimodal medical therapy including regular antiinflammatories: tylenol , ibuprofen  Injections:  has not received epidural steroid injections  Past Surgery: denies  Brenda Mckinney has symptoms of cervical myelopathy.  The symptoms are causing a significant impact on the patient's life.   I have utilized the care everywhere function in epic to review the outside records available from external health systems.  Review of Systems:  A 10 point review of systems is negative, except for the pertinent positives and negatives detailed in the HPI.  Past Medical History: Past Medical History:  Diagnosis Date   Arthritis    Asthma    Bell's palsy    Cancer (HCC)    thyroid   Hyperlipemia    Hypertension    Hypothyroidism    Lung, hamartoma (HCC) 2017   Stroke [facility])    DATE  UNKNOWN   Thyroid disease     Past Surgical History: Past Surgical History:  Procedure Laterality Date   ABDOMINAL HYSTERECTOMY  2001   ANTERIOR CERVICAL DECOMPRESSION/DISCECTOMY FUSION 4 LEVELS N/A 05/09/2022   Procedure: C3-7 ANTERIOR CERVICAL DISCECTOMY AND FUSION;  Surgeon: Clois Fret, MD;  Location: [facility] ORS;  Service: Neurosurgery;  Laterality: N/A;   CATARACT EXTRACTION W/ INTRAOCULAR LENS  IMPLANT, BILATERAL Bilateral 2020   COLONOSCOPY  09/10/2019   COLONOSCOPY  2011   TOTAL HIP ARTHROPLASTY Right 06/04/2019   Procedure: RIGHT TOTAL HIP ARTHROPLASTY ANTERIOR APPROACH;  Surgeon: Kathlynn Sharper, MD;  Location: [facility] ORS;  Service: Orthopedics;  Laterality: Right;   TOTAL THYROIDECTOMY  2011   TUBAL LIGATION  1987   VIDEO ASSISTED THORACOSCOPY (VATS)/THOROCOTOMY Right 07/25/2015   Procedure: RIGHT THOROCOSCOPY REMOVAL RIGHT LOWER LOBE MASS, PREOP BRONCHOSCOPY;  Surgeon: Evalene Glasser, MD;  Location: [facility] ORS;  Service: General;  Laterality: Right;    Allergies: Allergies as of 09/12/2023   (No Known Allergies)    Medications: Current Meds  Medication Sig   acetaminophen  (TYLENOL ) 500 MG tablet Take 1,000 mg by mouth every 8 (eight) hours as needed.   albuterol  (PROVENTIL ) (2.5 MG/3ML) 0.083% nebulizer solution Take 2.5 mg by nebulization every 6 (six) hours as needed for wheezing or shortness of breath.   Albuterol  Sulfate 108 (90 Base) MCG/ACT AEPB Inhale 2 puffs into the lungs every 4 (four) hours as needed (shortness of breath or wheezing).    amLODipine  (NORVASC ) 5 MG tablet Take 5 mg by mouth daily.    aspirin  EC 81 MG tablet Take 81 mg by mouth daily.   FASENRA PEN 30 MG/ML prefilled autoinjector    hydrochlorothiazide  (HYDRODIURIL ) 25 MG tablet Take 25 mg by mouth daily.   levothyroxine  (SYNTHROID , LEVOTHROID) 112 MCG tablet Take 112 mcg by mouth daily before breakfast.    lisinopril  (ZESTRIL ) 40 MG tablet Take 40 mg by mouth daily.   meloxicam  (MOBIC ) 15 MG  tablet Take 0.5-1 tablets (7.5-15 mg total) by mouth daily.   metoprolol  tartrate (LOPRESSOR ) 25 MG tablet Take 25 mg by mouth 2 (two) times daily.    montelukast  (SINGULAIR ) 10 MG tablet Take 10 mg by mouth at bedtime.   potassium chloride  (KLOR-CON ) 10 MEQ tablet Take 10 mEq by mouth daily.   pregabalin  (LYRICA ) 50 MG capsule Take 1 capsule (50 mg total) by mouth 3 (three) times daily.   simvastatin  (ZOCOR ) 40 MG tablet Take 40 mg by mouth at bedtime.   SYMBICORT 160-4.5 MCG/ACT inhaler Inhale into the lungs.    Social History: Social History   Tobacco Use   Smoking status: Never   Smokeless tobacco: Never  Vaping Use   Vaping status: Never Used  Substance Use Topics   Alcohol use: Yes    Comment: rare   Drug use: No  Family Medical History: Family History  Problem Relation Age of Onset   Breast cancer Paternal Aunt    Lymphoma Sister     Physical Examination: Vitals:   09/12/23 1149  BP: 130/84    General: Patient is well developed, well nourished, calm, collected, and in no apparent distress. Attention to examination is appropriate.  Neck:   Supple.  Full range of motion.  Respiratory: Patient is breathing without any difficulty.   NEUROLOGICAL:     Awake, alert, oriented to person, place, and time.  Speech is clear and fluent.   Cranial Nerves: Pupils equal round and reactive to light.  Facial tone is symmetric.  Facial sensation is symmetric. Shoulder shrug is symmetric. Tongue protrusion is midline.  There is no pronator drift.  ROM of spine: full.    Strength: MAEW Has some hesitancy with lifting her left arm above the shoulder.  She has deltoid weakness on the left side.  She has numbness in her left index finger.  Medical Decision Making  Imaging: MRI C spine 03/07/2022 Disc levels:   Foramen magnum is widely patent. Mild degenerative changes C1-2 but no stenosis.   C2-3: Minimal uncovertebral prominence.  No stenosis.   C3-4: Endplate  osteophytes and bulging of the disc. Narrowing of the ventral subarachnoid space but no compression of cord. AP diameter of the canal in the midline 6.5 mm. There may be some cord atrophy at this level. Bilateral foraminal narrowing that could affect either C4 nerve.   C4-5: Spondylosis with endplate osteophytes and protruding disc material more prominent towards the left. Spinal stenosis with AP diameter of the canal in the midline 6.2 mm. Cord volume loss without abnormal T2 signal, particularly evident on the left side. Bilateral foraminal stenosis that could compress either C5 nerve.   C5-6: Spondylosis with endplate osteophytes and bulging disc material more prominent towards the right. Narrowing of the canal with AP diameter in the midline 7 mm. Flattening and myelomalacia on the right side of the cord at this level. Foraminal stenosis right worse than left. Either C6 nerve could be affected, more likely the right.   C6-7: Endplate osteophytes and bulging of the disc. Narrowing of the canal, AP diameter in the midline 7.5 mm. Mild cord deformity. Bilateral foraminal stenosis that could affect either C7 nerve.   C7-T1: Uncovertebral prominence right worse than left. Bilateral facet degeneration. No compressive canal stenosis. Bilateral foraminal stenosis right worse than left could affect either C8 nerve, particularly the right.   IMPRESSION: 1. 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 Canal stenosis with cord deformity, most severe at C4-5 where AP diameter of the canal in the midline is 6.2 mm. Cord volume loss at C4-5 worse on the left than the right. Myelomalacia additionally on the right at C5-6. 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 2. Bilateral foraminal stenosis at C3-4, C4-5, C5-6, C6-7 and C7-T1 that could cause neural compression on either or both sides.     Electronically Signed   By: Oneil Officer M.D.   On: 03/07/2022  18:30  MRI C spine 08/04/2022 Disc levels:   C2-3: Negative.   C3-4: Interval fusion present. Canal is slightly decompressed. Measures 7.5 mm maximally. Moderate foraminal narrowing is present bilaterally.   C4-5: Anterior fusion is present. The central canal is slightly decompressed, now measuring 7 mm minimally. Mild residual foraminal narrowing is present bilaterally.   C5-6: Anterior fusion is present. Canal is slightly decompressed a measuring 8 mm minimally. No distortion  of the spinal cord remains. Moderate foraminal stenosis is similar the prior study, right greater than left.   C6-7: Interval fusion is present. Moderate residual foraminal narrowing is present bilaterally.   C7-T1: A rightward disc protrusion and uncovertebral spurring is stable. Moderate right foraminal stenosis is present.   IMPRESSION: 1. Interval ACDF at C3-4, C4-5, C5-6, and C6-7 with decompression of the central canal at these levels. 2. Moderate foraminal narrowing bilaterally at C3-4, C4-5, C5-6, and C6-7. 3. Residual foraminal narrowing is greatest at C3-4 and C5-6. 4. Moderate right foraminal stenosis at C7-T1.     Electronically Signed   By: Lonni Necessary M.D.   On: 08/04/2022 21:09  MR C spine 05/07/2023 Disc levels:   C2-C3: The disk is normal in configuration. No facet arthropathy. Moderate bilateral uncovertebral joint disease. Moderate bilateral neuroforaminal stenosis. No spinal canal stenosis.   C3-C4: Disc osteophyte complex with anterior fusion. No facet arthropathy. Moderate bilateral uncovertebral joint disease. Moderate bilateral neuroforaminal stenosis. Mild spinal canal stenosis.   C4-C5: Disc osteophyte complex with anterior fusion. Mild bilateral facet arthropathy. Severe bilateral uncovertebral joint disease. Severe bilateral neuroforaminal stenosis. Mild spinal canal stenosis.   C5-C6: Disc osteophyte complexes anterior fusion. Mild bilateral facet  arthropathy. Severe bilateral uncovertebral joint disease. Severe bilateral neuroforaminal stenosis. Mild spinal canal stenosis.   C6-C7: Disc osteophyte complex. Mild bilateral facet arthropathy. Moderate left and mild right uncovertebral joint disease. Moderate left and mild right neuroforaminal stenosis. No spinal canal stenosis.   C7-T1: Disc osteophyte complex. Mild bilateral facet arthropathy. Severe bilateral uncovertebral joint disease. Severe bilateral neuroforaminal stenosis. No spinal canal stenosis.   IMPRESSION: 1. Anterior cervical fusion from C3 through C7. 2. Severe foraminal stenoses bilaterally at C4-C5, C5-C6, and C7-T1 secondary to uncovertebral joint disease and facet arthropathy. Moderate foraminal stenoses at other levels.     Electronically Signed   By: Clem Savory M.D.   On: 05/30/2023 10:18  CT C spine 05/07/2023 IMPRESSION: *Status post ACDF C3 down to C7. *No significant central canal narrowing. *No significant foraminal narrowing.     Electronically Signed   By: Franky Chard M.D.   On: 05/17/2023 16:32  MRI L spine 05/07/2023 IMPRESSION: 1. Severe canal stenoses at L3-L4 and L4-L5 secondary to disc bulging and facet arthropathy superimposed on prominent dorsal epidural fat. 2. Moderate foraminal stenosis bilaterally at L3-L4 and L4-L5.     Electronically Signed   By: Clem Savory M.D.   On: 05/30/2023 10:34  I have personally reviewed the images and agree with the above interpretation.  Assessment and Plan: Brenda Mckinney is a pleasant 63 y.o. female with cervical myelopathy and radiculopathy with some residual symptoms.   She does have some residual foraminal stenosis.  Her symptoms are somewhat better right now.  She will continue meloxicam  and would like to talk with Dr. Avanell about another injection.  I will send a message to Dr. Kathlynn to reevaluate her left hip.     I spent a total of 15 minutes in both face-to-face and  non-face-to-face activities for this visit on the date of this encounter.   Reeves Daisy MD Neurosurgery

## 2023-09-13 MED ORDER — MELOXICAM 15 MG PO TABS: 7.5000 mg | ORAL_TABLET | Freq: Every day | ORAL | 0 refills | Status: DC

## 2023-09-13 NOTE — Addendum Note (Signed)
 Addended by: GREGORY EDSEL CROME on: 09/13/2023 07:50 AM   Modules accepted: Orders

## 2023-10-02 ENCOUNTER — Ambulatory Visit
Admission: RE | Admit: 2023-10-02 | Discharge: 2023-10-02 | Disposition: A | Discharge status: Home / Self Care | Source: Ambulatory Visit | Attending: Internal Medicine | Admitting: Internal Medicine

## 2023-10-02 DIAGNOSIS — Z1231 Encounter for screening mammogram for malignant neoplasm of breast: Secondary | ICD-10-CM | POA: Diagnosis present

## 2023-10-11 ENCOUNTER — Other Ambulatory Visit: Payer: Self-pay | Admitting: Orthopedic Surgery

## 2023-10-15 ENCOUNTER — Telehealth: Payer: Self-pay

## 2023-10-15 DIAGNOSIS — G8929 Other chronic pain: Secondary | ICD-10-CM

## 2023-10-15 DIAGNOSIS — Z981 Arthrodesis status: Secondary | ICD-10-CM

## 2023-10-15 NOTE — Telephone Encounter (Signed)
 We have received a fax from [place] in regards to refilling the Meloxicam . However in our system it shows it was last sent in on 09/13/23 and on the paper it says last filled in April 2025.  Can you ask the patient if she truly needs a refill yet?

## 2023-10-17 MED ORDER — MELOXICAM 15 MG PO TABS: 7.5000 mg | ORAL_TABLET | Freq: Every day | ORAL | 0 refills | Status: AC

## 2023-10-17 NOTE — Telephone Encounter (Signed)
 She can take meloxicam  as needed for pain and inflammation. If it is helping, then I would continue it and not take tylenol .   If meloxicam  is not helping, then she can try tylenol  instead or we can try another anti-inflammatory medication.   Let me know if she wants to continue on meloxicam  and I will send a refill.

## 2023-10-17 NOTE — Telephone Encounter (Signed)
 I spoke to patient and she states she would like a refill of the Meloxicam  and she will not take Tylenol .

## 2023-10-17 NOTE — Addendum Note (Signed)
 Addended byBETHA HILMA HASTINGS on: 10/17/2023 04:58 PM   Modules accepted: Orders

## 2023-10-17 NOTE — Telephone Encounter (Signed)
 Patient advised.

## 2023-10-17 NOTE — Telephone Encounter (Signed)
 She was given script for #90 mobic  on 09/13/23 but pharmacy only dispensed #30.   Let her know refill was sent to pharmacy. She does not have a follow up with us  and will need to get further refills from her PCP (or schedule a follow up).

## 2023-10-23 ENCOUNTER — Telehealth: Payer: Self-pay

## 2023-10-23 NOTE — Telephone Encounter (Signed)
 Left VM for patient to call correct office which is [facility] . Ask to speak with Mrs. Bowling and phone number is [phone].   JM   Copied from CRM #[id number]. Topic: General - Call Back - No Documentation >> Oct 23, 2023 11:26 AM Tonda B wrote: Reason for CRM: patient missed a call from office please call pt back [phone] (M)
